# Patient Record
Sex: Female | Born: 1990 | Race: White | Hispanic: No | Marital: Single | Smoking: Never smoker
Health system: Southern US, Community
[De-identification: ages and names within clinical notes are randomized; demographics above are authoritative.]

## PROBLEM LIST (undated history)

## (undated) DIAGNOSIS — F329 Major depressive disorder, single episode, unspecified: Secondary | ICD-10-CM

## (undated) DIAGNOSIS — M797 Fibromyalgia: Secondary | ICD-10-CM

## (undated) DIAGNOSIS — F32A Depression, unspecified: Secondary | ICD-10-CM

## (undated) DIAGNOSIS — K589 Irritable bowel syndrome without diarrhea: Secondary | ICD-10-CM

## (undated) DIAGNOSIS — T7840XA Allergy, unspecified, initial encounter: Secondary | ICD-10-CM

## (undated) DIAGNOSIS — B019 Varicella without complication: Secondary | ICD-10-CM

## (undated) DIAGNOSIS — G43909 Migraine, unspecified, not intractable, without status migrainosus: Secondary | ICD-10-CM

## (undated) DIAGNOSIS — N3010 Interstitial cystitis (chronic) without hematuria: Secondary | ICD-10-CM

## (undated) DIAGNOSIS — F419 Anxiety disorder, unspecified: Secondary | ICD-10-CM

## (undated) HISTORY — DX: Interstitial cystitis (chronic) without hematuria: N30.10

## (undated) HISTORY — DX: Allergy, unspecified, initial encounter: T78.40XA

## (undated) HISTORY — DX: Depression, unspecified: F32.A

## (undated) HISTORY — DX: Migraine, unspecified, not intractable, without status migrainosus: G43.909

## (undated) HISTORY — DX: Varicella without complication: B01.9

## (undated) HISTORY — DX: Irritable bowel syndrome without diarrhea: K58.9

## (undated) HISTORY — DX: Anxiety disorder, unspecified: F41.9

## (undated) HISTORY — DX: Major depressive disorder, single episode, unspecified: F32.9

---
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
## 2011-10-26 LAB — HM PAP SMEAR: HM Pap smear: NORMAL

## 2012-10-04 ENCOUNTER — Ambulatory Visit: Payer: Self-pay | Admitting: Internal Medicine

## 2012-10-13 ENCOUNTER — Encounter: Payer: Self-pay | Admitting: Internal Medicine

## 2012-10-25 ENCOUNTER — Encounter: Payer: Self-pay | Admitting: Internal Medicine

## 2012-10-25 ENCOUNTER — Ambulatory Visit (INDEPENDENT_AMBULATORY_CARE_PROVIDER_SITE_OTHER): Payer: BC Managed Care – PPO | Admitting: Internal Medicine

## 2012-10-25 VITALS — BP 108/72 | HR 109 | Temp 98.9°F | Resp 16 | Ht 65.0 in | Wt 132.5 lb

## 2012-10-25 DIAGNOSIS — N926 Irregular menstruation, unspecified: Secondary | ICD-10-CM

## 2012-10-25 DIAGNOSIS — R002 Palpitations: Secondary | ICD-10-CM

## 2012-10-25 DIAGNOSIS — Z30011 Encounter for initial prescription of contraceptive pills: Secondary | ICD-10-CM

## 2012-10-25 DIAGNOSIS — G43709 Chronic migraine without aura, not intractable, without status migrainosus: Secondary | ICD-10-CM | POA: Insufficient documentation

## 2012-10-25 DIAGNOSIS — N3010 Interstitial cystitis (chronic) without hematuria: Secondary | ICD-10-CM

## 2012-10-25 DIAGNOSIS — Z309 Encounter for contraceptive management, unspecified: Secondary | ICD-10-CM

## 2012-10-25 DIAGNOSIS — R300 Dysuria: Secondary | ICD-10-CM

## 2012-10-25 DIAGNOSIS — Z789 Other specified health status: Secondary | ICD-10-CM

## 2012-10-25 DIAGNOSIS — D509 Iron deficiency anemia, unspecified: Secondary | ICD-10-CM

## 2012-10-25 DIAGNOSIS — Z3009 Encounter for other general counseling and advice on contraception: Secondary | ICD-10-CM

## 2012-10-25 HISTORY — DX: Interstitial cystitis (chronic) without hematuria: N30.10

## 2012-10-25 LAB — CBC WITH DIFFERENTIAL/PLATELET
Basophils Relative: 0.8 % (ref 0.0–3.0)
Eosinophils Relative: 2.8 % (ref 0.0–5.0)
HCT: 36.3 % (ref 36.0–46.0)
Hemoglobin: 12.1 g/dL (ref 12.0–15.0)
Lymphocytes Relative: 43.7 % (ref 12.0–46.0)
MCHC: 33.5 g/dL (ref 30.0–36.0)
Platelets: 382 10*3/uL (ref 150.0–400.0)
RBC: 4.45 Mil/uL (ref 3.87–5.11)
WBC: 6.6 10*3/uL (ref 4.5–10.5)

## 2012-10-25 LAB — POCT URINALYSIS DIPSTICK
Blood, UA: NEGATIVE
Leukocytes, UA: NEGATIVE
Nitrite, UA: NEGATIVE
Protein, UA: NEGATIVE
Spec Grav, UA: 1.025
Urobilinogen, UA: 0.2

## 2012-10-25 LAB — POCT URINE PREGNANCY: Preg Test, Ur: NEGATIVE

## 2012-10-25 MED ORDER — LEVONORGEST-ETH ESTRAD 91-DAY 0.15-0.03 &0.01 MG PO TABS: ORAL_TABLET | ORAL | Status: DC

## 2012-10-25 MED ORDER — METOPROLOL SUCCINATE ER 25 MG PO TB24: 25.0000 mg | ORAL_TABLET | Freq: Every day | ORAL | Status: DC

## 2012-10-25 MED ORDER — HYOSCYAMINE SULFATE 0.125 MG SL SUBL: 0.1250 mg | SUBLINGUAL_TABLET | SUBLINGUAL | Status: DC | PRN

## 2012-10-25 NOTE — Assessment & Plan Note (Signed)
Normal chest x ray.  Checking tsh, cbc and lytes.

## 2012-10-25 NOTE — Progress Notes (Signed)
Patient ID: Shelby Terry, female   DOB: 1990/06/09, 22 y.o.   MRN: [id number]   Patient Active Problem List   Diagnosis Date Noted  . Menstrual cycle disorder 10/25/2012  . Migraine, chronic, without aura 10/25/2012  . Chronic interstitial cystitis 10/25/2012    Subjective:  CC:   Chief Complaint  Patient presents with  . Establish Care    HPI:   Shelby Terry is a 22 y.o. female who presents as a new patient to establish primary care with the chief complaint of Knee pain .  Has stopped working out because  Of popliteal fullness which culminatesin a pop with is painful,  Followed by sweeling and pain ,  Wears a knee brace,  swellign reoslves for a few weeks ,  Then starts again.  Walks and runs for 30 minutes daily followed by weight training has avoided squats and lunges because o fthis issue,. No prior x rays or ultrasounds.    Xray of left hip of inguinal pain,  Was normal.  Pain resolved.    migraines ,  Chronic, now perimenstrual lasting 3 to 4 days, despite use of birth control for management of PMDD with heavy cramping and menorrhagia. Now also for pregnancy prevention.  Trial of elavil  With previous titrations  Not successful.  Prior neurology eval in high school  For prophylaxis with multiple trials,  None worked.  Records from other doc needed.    Interstitial cystis/IBS/migraines.  IC diagnosed by Urology in [place] by [place].  Frustrated  Has not seen him in 3 years bc they offered no therapy except surgery.  Sy,mptoms have improved with improved hydration, diet and exercise.  Occurring once a week,  Aggravated by alcohol and sex. Marland Kitchen No prior trial of antispasmodic. Having symptoms of endometriosis.  Not sure if the tow are related. No prior formal urogyn eval.    Shortness of breath,  Random,  Feels like she can't get a full breath in,  Accompanied by palpitations but never chest pain . Has occurred during workouts and at rest,  Lately all at rest.  Caffeine consumption  is zero.       Past Medical History  Diagnosis Date  . Chicken pox 1994  . Depression   . Allergy   . Irritable bowel syndrome y-8  . Migraines 2008    History reviewed. No pertinent past surgical history.  Family History  Problem Relation Age of Onset  . Hypertension Mother   . Hyperlipidemia Father   . Hypertension Father   . Diabetes Father   . Cancer Maternal Uncle   . Cancer Paternal Aunt   . Cancer Maternal Grandmother   . Hypertension Maternal Grandfather   . Hyperlipidemia Maternal Grandfather   . Cancer Paternal Grandmother   . Cancer Paternal Grandfather     History   Social History  . Marital Status: Single    Spouse Name: N/A    Number of Children: N/A  . Years of Education: N/A   Occupational History  . Not on file.   Social History Main Topics  . Smoking status: Never Smoker   . Smokeless tobacco: Never Used  . Alcohol Use: Yes  . Drug Use: No  . Sexually Active: Yes -- Female partner(s)   Other Topics Concern  . Not on file   Social History Narrative  . No narrative on file    Allergies  Allergen Reactions  . Aspirin Swelling    Throat swelling  . Ibuprofen Swelling  Throat swelling       Review of Systems:   The remainder of the review of systems was negative except those addressed in the HPI.   Objective:  BP 108/72  Pulse 109  Temp(Src) 98.9 F (37.2 C) (Oral)  Resp 16  Ht 5\' 5"  (1.651 m)  Wt 132 lb 8 oz (60.102 kg)  BMI 22.05 kg/m2  SpO2 98%  LMP 10/05/2012  General appearance: alert, cooperative and appears stated age Ears: normal TM's and external ear canals both ears Throat: lips, mucosa, and tongue normal; teeth and gums normal Neck: no adenopathy, no carotid bruit, supple, symmetrical, trachea midline and thyroid not enlarged, symmetric, no tenderness/mass/nodules Back: symmetric, no curvature. ROM normal. No CVA tenderness. Lungs: clear to auscultation bilaterally Heart: regular rate and rhythm, S1, S2  normal, no murmur, click, rub or gallop Abdomen: soft, non-tender; bowel sounds normal; no masses,  no organomegaly Pulses: 2+ and symmetric Skin: Skin color, texture, turgor normal. No rashes or lesions Lymph nodes: Cervical, supraclavicular, and axillary nodes normal.  Assessment and Plan:  Menstrual cycle disorder Trial of seasonique,  Checking thyroid function  Migraine, chronic, without aura Trial of beta blocker and seasonique to reduce hormonal fluctuations   Chronic interstitial cystitis Checking UA,  Trial of hyoscyamine.  Palpitations Normal chest x ray.  Checking tsh, cbc and lytes.    Updated Medication List Outpatient Encounter Prescriptions as of 10/25/2012  Medication Sig Dispense Refill  . amitriptyline (ELAVIL) 25 MG tablet Take 25 mg by mouth at bedtime.      . hyoscyamine (LEVSIN SL) 0.125 MG SL tablet Place 1 tablet (0.125 mg total) under the tongue every 4 (four) hours as needed for cramping.  30 tablet  3  . Levonorgestrel-Ethinyl Estradiol (AMETHIA,CAMRESE) 0.15-0.03 &0.01 MG tablet 1 tablet daily as directed on package  1 Package  4  . metoprolol succinate (TOPROL-XL) 25 MG 24 hr tablet Take 1 tablet (25 mg total) by mouth daily.  30 tablet  3  . norethindrone-ethinyl estradiol (MICROGESTIN,JUNEL,LOESTRIN) 1-20 MG-MCG tablet Take 1 tablet by mouth daily.      . sertraline (ZOLOFT) 25 MG tablet Take 25 mg by mouth daily.       No facility-administered encounter medications on file as of 10/25/2012.

## 2012-10-25 NOTE — Assessment & Plan Note (Signed)
Checking UA,  Trial of hyoscyamine.

## 2012-10-25 NOTE — Patient Instructions (Addendum)
We are starting several new medications today :  1) Toprol XL:  Take once daily 77for pulse and headache prevention) , can take at bedtime if needed  2) Seasonique:  New 90 day birth control (active tablet daily for 84 days,  Then inert tablet for 7 days) Start on the Sunday after the onset of bleeding/menstruation  3) hysocyamine: antispasmodic to take as needed for pelvic cramping  Urine test and blood tests today for baseline labs on thryoid,  Anemia,  Electrolytes,  Etc.

## 2012-10-25 NOTE — Assessment & Plan Note (Signed)
Trial of seasonique,  Checking thyroid function

## 2012-10-25 NOTE — Assessment & Plan Note (Signed)
Trial of beta blocker and seasonique to reduce hormonal fluctuations

## 2012-10-26 DIAGNOSIS — D509 Iron deficiency anemia, unspecified: Secondary | ICD-10-CM | POA: Insufficient documentation

## 2012-10-26 LAB — COMPREHENSIVE METABOLIC PANEL
Albumin: 3.6 g/dL (ref 3.5–5.2)
Calcium: 8.7 mg/dL (ref 8.4–10.5)
GFR: 107.68 mL/min (ref >60.00)
Potassium: 4 mEq/L (ref 3.5–5.1)
Total Bilirubin: 0.4 mg/dL (ref 0.3–1.2)

## 2012-10-26 LAB — IRON AND TIBC
Iron: 32 ug/dL — ABNORMAL LOW (ref 42–145)
UIBC: >575 ug/dL — ABNORMAL HIGH (ref 125–400)

## 2012-10-26 MED ORDER — FERROUS FUM-IRON POLYSACCH 162-115.2 MG PO CAPS: 1.0000 | ORAL_CAPSULE | Freq: Every day | ORAL | Status: DC

## 2012-10-26 NOTE — Addendum Note (Signed)
Addended by: Sherlene Shams on: 10/26/2012 09:27 PM   Modules accepted: Orders

## 2012-11-08 ENCOUNTER — Other Ambulatory Visit (HOSPITAL_COMMUNITY)
Admission: RE | Admit: 2012-11-08 | Discharge: 2012-11-08 | Disposition: A | Discharge status: Home / Self Care | Payer: BC Managed Care – PPO | Source: Ambulatory Visit | Attending: Internal Medicine | Admitting: Internal Medicine

## 2012-11-08 ENCOUNTER — Encounter: Payer: Self-pay | Admitting: Internal Medicine

## 2012-11-08 ENCOUNTER — Ambulatory Visit (INDEPENDENT_AMBULATORY_CARE_PROVIDER_SITE_OTHER): Payer: BC Managed Care – PPO | Admitting: Internal Medicine

## 2012-11-08 VITALS — BP 108/62 | HR 93 | Temp 98.7°F | Resp 16 | Ht 64.0 in | Wt 132.8 lb

## 2012-11-08 DIAGNOSIS — Z1151 Encounter for screening for human papillomavirus (HPV): Secondary | ICD-10-CM | POA: Insufficient documentation

## 2012-11-08 DIAGNOSIS — Z124 Encounter for screening for malignant neoplasm of cervix: Secondary | ICD-10-CM

## 2012-11-08 DIAGNOSIS — D509 Iron deficiency anemia, unspecified: Secondary | ICD-10-CM

## 2012-11-08 DIAGNOSIS — Z01419 Encounter for gynecological examination (general) (routine) without abnormal findings: Secondary | ICD-10-CM | POA: Insufficient documentation

## 2012-11-08 DIAGNOSIS — N3010 Interstitial cystitis (chronic) without hematuria: Secondary | ICD-10-CM

## 2012-11-08 DIAGNOSIS — Z0000 Encounter for general adult medical examination without abnormal findings: Secondary | ICD-10-CM

## 2012-11-08 DIAGNOSIS — G43709 Chronic migraine without aura, not intractable, without status migrainosus: Secondary | ICD-10-CM

## 2012-11-08 DIAGNOSIS — Z113 Encounter for screening for infections with a predominantly sexual mode of transmission: Secondary | ICD-10-CM

## 2012-11-08 DIAGNOSIS — K589 Irritable bowel syndrome without diarrhea: Secondary | ICD-10-CM

## 2012-11-08 NOTE — Progress Notes (Signed)
Patient ID: Shelby Terry, female   DOB: February 24, 1991, 24 y.o.   MRN: [id number]   Subjective:     Shelby Terry is a 22 y.o. female and is here for a comprehensive physical exam. The patient reports mild constipation from iron supplements prescribed for iron deficiency secondary to menorrhagia.   Periods are heavy but regulated by OCPS,  In a monogamous relationship but not using barrier protection, sexually actve. She is moving to  [place] in one week for grad school    History   Social History  . Marital Status: Single    Spouse Name: N/A    Number of Children: N/A  . Years of Education: N/A   Occupational History  . Not on file.   Social History Main Topics  . Smoking status: Never Smoker   . Smokeless tobacco: Never Used  . Alcohol Use: Yes  . Drug Use: No  . Sexually Active: Yes -- Female partner(s)   Other Topics Concern  . Not on file   Social History Narrative  . No narrative on file   Health Maintenance  Topic Date Due  . Tetanus/tdap  01/11/2008  . Influenza Vaccine  01/09/2013  . Pap Smear  11/09/2015    The following portions of the patient's history were reviewed and updated as appropriate: allergies, current medications, past family history, past medical history, past social history, past surgical history and problem list.  Review of Systems A comprehensive review of systems was negative.   Objective:  BP 108/62  Pulse 93  Temp(Src) 98.7 F (37.1 C) (Oral)  Resp 16  Ht 5\' 4"  (1.626 m)  Wt 132 lb 12 oz (60.215 kg)  BMI 22.78 kg/m2  SpO2 100%  LMP 11/01/2012   General Appearance:    Alert, cooperative, no distress, appears stated age  Head:    Normocephalic, without obvious abnormality, atraumatic  Eyes:    PERRL, conjunctiva/corneas clear, EOM's intact, fundi    benign, both eyes  Ears:    Normal TM's and external ear canals, both ears  Nose:   Nares normal, septum midline, mucosa normal, no drainage    or sinus tenderness  Throat:   Lips,  mucosa, and tongue normal; teeth and gums normal  Neck:   Supple, symmetrical, trachea midline, no adenopathy;    thyroid:  no enlargement/tenderness/nodules; no carotid   bruit or JVD  Back:     Symmetric, no curvature, ROM normal, no CVA tenderness  Lungs:     Clear to auscultation bilaterally, respirations unlabored  Chest Wall:    No tenderness or deformity   Heart:    Regular rate and rhythm, S1 and S2 normal, no murmur, rub   or gallop  Breast Exam:    No tenderness, masses, or nipple abnormality  Abdomen:     Soft, non-tender, bowel sounds active all four quadrants,    no masses, no organomegaly  Genitalia:    Pelvic: cervix normal in appearance, external genitalia normal, no adnexal masses or tenderness, no cervical motion tenderness, rectovaginal septum normal, uterus normal size, shape, and consistency and vagina normal without discharge  Extremities:   Extremities normal, atraumatic, no cyanosis or edema  Pulses:   2+ and symmetric all extremities  Skin:   Skin color, texture, turgor normal, no rashes or lesions  Lymph nodes:   Cervical, supraclavicular, and axillary nodes normal  Neurologic:   CNII-XII intact, normal strength, sensation and reflexes    throughout     Assessment:   Anemia,  iron deficiency Oral iron prescribed.  Some constipation resulting.  Colace recommended.   Routine general medical examination at a health care facility Annual comprehensive exam was done including breast, pelvic and PAP smear. STD screenings  Done today  Chronic interstitial cystitis Managed with elavil.  No changes today  Migraine, chronic, without aura She did not start the beta blocker.  Continue OCPS  IBS (irritable bowel syndrome) Diagnosed by pror PCP.  Adding prn hyoscyamine, Increase zoloft  to 50 mg discussed today.    Updated Medication List Outpatient Encounter Prescriptions as of 11/08/2012  Medication Sig Dispense Refill  . amitriptyline (ELAVIL) 25 MG tablet Take 25  mg by mouth at bedtime.      . ferrous fumarate-iron polysaccharide complex (TANDEM) 162-115.2 MG CAPS Take 1 capsule by mouth daily with breakfast.  30 capsule  5  . hyoscyamine (LEVSIN SL) 0.125 MG SL tablet Place 1 tablet (0.125 mg total) under the tongue every 4 (four) hours as needed for cramping.  30 tablet  3  . Levonorgestrel-Ethinyl Estradiol (AMETHIA,CAMRESE) 0.15-0.03 &0.01 MG tablet 1 tablet daily as directed on package  1 Package  4  . sertraline (ZOLOFT) 25 MG tablet Take 25 mg by mouth daily.      . [DISCONTINUED] metoprolol succinate (TOPROL-XL) 25 MG 24 hr tablet Take 1 tablet (25 mg total) by mouth daily.  30 tablet  3  . [DISCONTINUED] norethindrone-ethinyl estradiol (MICROGESTIN,JUNEL,LOESTRIN) 1-20 MG-MCG tablet Take 1 tablet by mouth daily.       No facility-administered encounter medications on file as of 11/08/2012.

## 2012-11-08 NOTE — Patient Instructions (Addendum)
Add colace daily for the iron induced constipation  100 mg once or twice daily (as needed)  Trial increasing your dose of sertraline to 50 mg for a few weeks to treat your IBS and intersitital cystitis .  I Will refill at 25 mg for now; we can change the prescription dose if higher dose helps.    Screening for HIV, Hep C, etc today (advised for all sexually active adults)   I would repeat your iron studies in 3 months

## 2012-11-09 LAB — HIV ANTIBODY (ROUTINE TESTING W REFLEX): HIV: NONREACTIVE

## 2012-11-09 LAB — HEPATITIS B SURFACE ANTIBODY,QUALITATIVE: Hep B S Ab: REACTIVE — AB

## 2012-11-11 DIAGNOSIS — Z0000 Encounter for general adult medical examination without abnormal findings: Secondary | ICD-10-CM | POA: Insufficient documentation

## 2012-11-11 DIAGNOSIS — K921 Melena: Secondary | ICD-10-CM | POA: Insufficient documentation

## 2012-11-11 NOTE — Assessment & Plan Note (Signed)
She did not start the beta blocker.  Continue OCPS

## 2012-11-11 NOTE — Assessment & Plan Note (Signed)
Oral iron prescribed.  Some constipation resulting.  Colace recommended.

## 2012-11-11 NOTE — Assessment & Plan Note (Signed)
Managed with elavil.  No changes today

## 2012-11-11 NOTE — Assessment & Plan Note (Addendum)
Diagnosed by pror PCP.  Adding prn hyoscyamine, Increase zoloft  to 50 mg discussed today.

## 2012-11-11 NOTE — Assessment & Plan Note (Signed)
Annual comprehensive exam was done including breast, pelvic and PAP smear. STD screenings  Done today

## 2012-11-15 ENCOUNTER — Telehealth: Payer: Self-pay | Admitting: *Deleted

## 2012-11-15 MED ORDER — AMITRIPTYLINE HCL 25 MG PO TABS: 25.0000 mg | ORAL_TABLET | Freq: Every day | ORAL | Status: DC

## 2012-11-15 MED ORDER — SERTRALINE HCL 25 MG PO TABS: 25.0000 mg | ORAL_TABLET | Freq: Every day | ORAL | Status: DC

## 2012-11-15 NOTE — Telephone Encounter (Signed)
Patient needs refill Amitriptyline and Zoloft sent to [facility] in [place]. Fax [phone]. Patient at school .

## 2012-11-15 NOTE — Telephone Encounter (Signed)
That [facility] is not in system.  Can fax once signed

## 2012-11-16 NOTE — Telephone Encounter (Signed)
Patient notified as requested. 

## 2013-10-11 ENCOUNTER — Other Ambulatory Visit: Payer: Self-pay | Admitting: *Deleted

## 2013-10-11 MED ORDER — LEVONORGEST-ETH ESTRAD 91-DAY 0.15-0.03 &0.01 MG PO TABS: ORAL_TABLET | ORAL | Status: DC

## 2013-10-11 MED ORDER — SERTRALINE HCL 25 MG PO TABS: 25.0000 mg | ORAL_TABLET | Freq: Every day | ORAL | Status: DC

## 2013-10-11 MED ORDER — AMITRIPTYLINE HCL 25 MG PO TABS: 25.0000 mg | ORAL_TABLET | Freq: Every day | ORAL | Status: DC

## 2013-10-11 NOTE — Telephone Encounter (Signed)
Ok to refill,  Authorized in epic but sent to [facility] via epic please correct

## 2013-10-11 NOTE — Telephone Encounter (Signed)
Rx faxed to pharmacy  

## 2013-10-11 NOTE — Telephone Encounter (Signed)
Pt's mother sent mychart message: "Shelby Terry can't come home until Spring 2016 due to required [employer] school schedule and cost of flying home. She used [facility] [phone], but I think we transferred scripts from [facility]. She needs the following scripts renewed: Sertraline 25 mg TAB LEG (Qty 90) 1 time daily Camrese TAB TEV (Qty 91) 1 time daily Amitriptylin (Qty 90) 1 time daily Thank you! Shelby Terry"   Last visit 7/14. Ok refills?

## 2014-01-29 ENCOUNTER — Telehealth: Payer: Self-pay | Admitting: Internal Medicine

## 2014-01-29 NOTE — Telephone Encounter (Signed)
Appointment schedule left voicemail with appointment.

## 2014-01-29 NOTE — Telephone Encounter (Signed)
Yes you can use th 5:15 and 5:30 slots

## 2014-01-29 NOTE — Telephone Encounter (Signed)
Pt's mom called in and stated pt will be in town on October 5th and was wanting to see if Dr. Darrick Huntsman could squeeze her in for a cpe that day at any time. Please advise.

## 2014-01-29 NOTE — Telephone Encounter (Signed)
Please advise no 30 min available that day unless we use acute?

## 2014-02-05 ENCOUNTER — Encounter: Payer: BC Managed Care – PPO | Admitting: Internal Medicine

## 2014-02-12 ENCOUNTER — Encounter: Payer: BC Managed Care – PPO | Admitting: Internal Medicine

## 2014-02-19 ENCOUNTER — Telehealth: Payer: Self-pay | Admitting: Internal Medicine

## 2014-02-19 NOTE — Telephone Encounter (Signed)
Patient DPR notified and patient notified patient is setting up MYChart account.

## 2014-02-19 NOTE — Telephone Encounter (Signed)
Olegario MessierKathy,  See my response below to mother:  Elnita MaxwellCheryl,  Neither one of those venues are HIPAA protected  so I cannot use them for patient related correspondence. .  I will have my staff call [name] so she can set up her MyChart acct.  Regards,  Dr. Darrick Huntsmanullo   ===View-only below this line===   ----- Message -----    From: Cipriano BunkerALBERTI,[name] A    Sent: 02/19/2014  9:04 AM EDT      To: Duncan DullULLO,TERESA, MD Subject: Non-Urgent Medical Question  Hi Dr Isac Sarnaullo, Shelby Terry did not make it into town so she will not be able to see you until Thanksgiving.  She has had a rash diagnosed as impetigo. She has been on oral and topical antibiotics but it is still oozing and spreading from breast and underarm,  but it has been almost 2 months.  Can you text her [phone] or email [email or web address] as she hasn't set up her my chart?  She has an appointment with you 04/04/14 3:45.  Thank you so much for helping her.  She has a lot of confidence in your opinion.  Thank you so much! Elnita Maxwellheryl

## 2014-02-20 ENCOUNTER — Encounter: Payer: Self-pay | Admitting: Internal Medicine

## 2014-04-04 ENCOUNTER — Encounter: Payer: Self-pay | Admitting: Internal Medicine

## 2014-04-04 ENCOUNTER — Ambulatory Visit (INDEPENDENT_AMBULATORY_CARE_PROVIDER_SITE_OTHER): Payer: BC Managed Care – PPO | Admitting: Internal Medicine

## 2014-04-04 VITALS — BP 122/78 | HR 114 | Temp 98.6°F | Resp 16 | Ht 64.25 in | Wt 133.5 lb

## 2014-04-04 DIAGNOSIS — Z23 Encounter for immunization: Secondary | ICD-10-CM

## 2014-04-04 DIAGNOSIS — N926 Irregular menstruation, unspecified: Secondary | ICD-10-CM

## 2014-04-04 DIAGNOSIS — Z0000 Encounter for general adult medical examination without abnormal findings: Secondary | ICD-10-CM

## 2014-04-04 DIAGNOSIS — G43709 Chronic migraine without aura, not intractable, without status migrainosus: Secondary | ICD-10-CM

## 2014-04-04 DIAGNOSIS — M130 Polyarthritis, unspecified: Secondary | ICD-10-CM

## 2014-04-04 DIAGNOSIS — D509 Iron deficiency anemia, unspecified: Secondary | ICD-10-CM

## 2014-04-04 DIAGNOSIS — K921 Melena: Secondary | ICD-10-CM

## 2014-04-04 LAB — CBC WITH DIFFERENTIAL/PLATELET
BASOS ABS: 0.1 10*3/uL (ref 0.0–0.1)
BASOS PCT: 1 % (ref 0–1)
Eosinophils Absolute: 0.1 10*3/uL (ref 0.0–0.7)
Eosinophils Relative: 2 % (ref 0–5)
HEMATOCRIT: 39.8 % (ref 36.0–46.0)
Hemoglobin: 13.2 g/dL (ref 12.0–15.0)
LYMPHS ABS: 2 10*3/uL (ref 0.7–4.0)
LYMPHS PCT: 32 % (ref 12–46)
MCH: 28 pg (ref 26.0–34.0)
MCHC: 33.2 g/dL (ref 30.0–36.0)
MCV: 84.3 fL (ref 78.0–100.0)
MPV: 9.1 fL — ABNORMAL LOW (ref 9.4–12.4)
Monocytes Absolute: 0.5 10*3/uL (ref 0.1–1.0)
Monocytes Relative: 9 % (ref 3–12)
Neutro Abs: 3.4 10*3/uL (ref 1.7–7.7)
Neutrophils Relative %: 56 % (ref 43–77)
Platelets: 414 10*3/uL — ABNORMAL HIGH (ref 150–400)
RBC: 4.72 MIL/uL (ref 3.87–5.11)
RDW: 14.1 % (ref 11.5–15.5)
WBC: 6.1 10*3/uL (ref 4.0–10.5)

## 2014-04-04 MED ORDER — ATENOLOL 25 MG PO TABS: 25.0000 mg | ORAL_TABLET | Freq: Every day | ORAL | Status: DC

## 2014-04-04 NOTE — Progress Notes (Signed)
Patient ID: Shelby Terry, female   DOB: 07/12/1990, 23 y.o.   MRN: [id number]     Subjective:     Shelby Terry is a 23 y.o. female and is here for a comprehensive physical exam. The patient reports multiple problems, requiring a 45 mintue visit today.  History   Social History  . Marital Status: Single    Spouse Name: N/A    Number of Children: N/A  . Years of Education: N/A   Occupational History  . Not on file.   Social History Main Topics  . Smoking status: Never Smoker   . Smokeless tobacco: Never Used  . Alcohol Use: Yes  . Drug Use: No  . Sexual Activity:    Partners: Male   Other Topics Concern  . Not on file   Social History Narrative   Health Maintenance  Topic Date Due  . Janet BerlinETANUS/TDAP  01/11/2008  . INFLUENZA VACCINE  12/10/2014  . PAP SMEAR  11/09/2015    The following portions of the patient's history were reviewed and updated as appropriate: allergies, current medications, past family history, past medical history, past social history, past surgical history and problem list.  Review of Systems A comprehensive review of systems was negative.   Objective:   BP 122/78 mmHg  Pulse 114  Temp(Src) 98.6 F (37 C) (Oral)  Resp 16  Ht 5' 4.25" (1.632 m)  Wt 133 lb 8 oz (60.555 kg)  BMI 22.74 kg/m2  SpO2 99%  LMP 01/30/2014 (Approximate)  General appearance: alert, cooperative and appears stated age Head: Normocephalic, without obvious abnormality, atraumatic Eyes: conjunctivae/corneas clear. PERRL, EOM's intact. Fundi benign. Ears: normal TM's and external ear canals both ears Nose: Nares normal. Septum midline. Mucosa normal. No drainage or sinus tenderness. Throat: lips, mucosa, and tongue normal; teeth and gums normal Neck: no adenopathy, no carotid bruit, no JVD, supple, symmetrical, trachea midline and thyroid not enlarged, symmetric, no tenderness/mass/nodules Lungs: clear to auscultation bilaterally Breasts: normal appearance, no masses  or tenderness Heart: regular rate and rhythm, S1, S2 normal, no murmur, click, rub or gallop Abdomen: soft, non-tender; bowel sounds normal; no masses,  no organomegaly Extremities: extremities normal, atraumatic, no cyanosis or edema Pulses: 2+ and symmetric Skin: Skin color, texture, turgor normal. No rashes or lesions Neurologic: Alert and oriented X 3, normal strength and tone. Normal symmetric reflexes. Normal coordination and gait.   .    Assessment and Plan:   Anemia, iron deficiency She did not tolerate Tandem due to recurrent nausea and remains iron deficient without anemia. Advised to try ferrous gluconate  (fergon) for better tolerability  Menstrual cycle disorder She continues to have breakthrough bleeding using Camrese and wants to have as few cycles as possible,.  Discussed changing to a formulation with higher doses of ethinyl estradiol and using the 91 day packs.  Will try Quartette,  Which has an escalating dose of ethinyl estradiol and may decrease the amount of breakthrough bleeding   Hematochezia Given her concurrent polyarthritis, need to rule out ulcerative colitis.  Referral to GI for evaluation .   Migraine, chronic, without aura Trial  of atenolol for prevention and continuous OCPS to decrease occurrence of menses   Visit for preventive health examination Annual wellness  exam was done as well as a comprehensive physical exam and management of acute and chronic conditions .  During the course of the visit the patient was educated and counseled about appropriate screening and preventive services including :  diabetes  screening, lipid analysis with projected  10 year  risk for CAD , nutrition counseling, colorectal cancer screening, and recommended immunizations.  Printed recommendations for health maintenance screenings was given.    Updated Medication List Outpatient Encounter Prescriptions as of 04/04/2014  Medication Sig  . hyoscyamine (LEVSIN SL) 0.125 MG SL  tablet Place 1 tablet (0.125 mg total) under the tongue every 4 (four) hours as needed for cramping.  . Levonorgestrel-Ethinyl Estradiol (AMETHIA,CAMRESE) 0.15-0.03 &0.01 MG tablet 1 tablet daily as directed on package  . sertraline (ZOLOFT) 25 MG tablet Take 1 tablet (25 mg total) by mouth daily.  . [DISCONTINUED] amitriptyline (ELAVIL) 25 MG tablet Take 1 tablet (25 mg total) by mouth at bedtime.  Marland Kitchen. atenolol (TENORMIN) 25 MG tablet Take 1 tablet (25 mg total) by mouth daily.  . ferrous gluconate (FERGON) 324 MG tablet Take 1 tablet (324 mg total) by mouth daily with breakfast.  . Levonorgest-Eth Est & Eth Est 42-21-21-7 DAYS TABS One tablet daily as directed  . [DISCONTINUED] ferrous fumarate-iron polysaccharide complex (TANDEM) 162-115.2 MG CAPS Take 1 capsule by mouth daily with breakfast. (Patient not taking: Reported on 04/04/2014)

## 2014-04-04 NOTE — Progress Notes (Signed)
Pre-visit discussion using our clinic review tool. No additional management support is needed unless otherwise documented below in the visit note.  

## 2014-04-04 NOTE — Patient Instructions (Signed)
We are changing your headache prevention medication to atenolol.  25 mg daily at bedtime   We are going to try to arrange a colonoscopy on your next break   I will notify you about the labs and change in contraception

## 2014-04-05 LAB — FERRITIN: FERRITIN: 8 ng/mL — AB (ref 10–291)

## 2014-04-05 LAB — IRON AND TIBC
%SAT: 15 % — AB (ref 20–55)
IRON: 86 ug/dL (ref 42–145)
TIBC: 592 ug/dL — ABNORMAL HIGH (ref 250–470)
UIBC: 506 ug/dL — ABNORMAL HIGH (ref 125–400)

## 2014-04-05 LAB — C-REACTIVE PROTEIN: CRP: 0.6 mg/dL — AB (ref <0.60)

## 2014-04-05 LAB — TSH: TSH: 2.854 u[IU]/mL (ref 0.350–4.500)

## 2014-04-07 MED ORDER — LEVONORGEST-ETH EST & ETH EST 42-21-21-7 DAYS PO TABS: ORAL_TABLET | ORAL | Status: DC

## 2014-04-07 MED ORDER — FERROUS GLUCONATE 324 (38 FE) MG PO TABS: 324.0000 mg | ORAL_TABLET | Freq: Every day | ORAL | Status: DC

## 2014-04-07 NOTE — Assessment & Plan Note (Addendum)
She continues to have breakthrough bleeding using Camrese and wants to have as few cycles as possible,.  Discussed changing to a formulation with higher doses of ethinyl estradiol and using the 91 day packs.  Will try Quartette,  Which has an escalating dose of ethinyl estradiol and may decrease the amount of breakthrough bleeding

## 2014-04-07 NOTE — Assessment & Plan Note (Addendum)
She did not tolerate Tandem due to recurrent nausea and remains iron deficient without anemia. Advised to try ferrous gluconate  (fergon) for better tolerability

## 2014-04-07 NOTE — Assessment & Plan Note (Signed)
Trial  of atenolol for prevention and continuous OCPS to decrease occurrence of menses

## 2014-04-07 NOTE — Assessment & Plan Note (Signed)
Given her concurrent polyarthritis, need to rule out ulcerative colitis.  Referral to GI for evaluation .

## 2014-04-07 NOTE — Assessment & Plan Note (Signed)

## 2014-04-08 ENCOUNTER — Encounter: Payer: Self-pay | Admitting: Internal Medicine

## 2014-04-09 MED ORDER — LEVONORGEST-ETH ESTRAD 91-DAY 0.15-0.03 &0.01 MG PO TABS: 1.0000 | ORAL_TABLET | Freq: Every day | ORAL | Status: DC

## 2014-04-09 NOTE — Telephone Encounter (Signed)
Shelby Terry,  I was unable to find an extended combination oral contraceptive taht wasn't any different than what you had, except for [facility], so I [facility] [place] [facility].    I'm sorry,  I don't remember which steroid cream we discussed and what it was for.  Can you referesh my memory?  Dr. Darrick Huntsmanullo

## 2014-04-10 ENCOUNTER — Telehealth: Payer: Self-pay | Admitting: *Deleted

## 2014-04-10 LAB — HLA-B27 ANTIGEN: DNA RESULT: NOT DETECTED

## 2014-04-10 MED ORDER — AMITRIPTYLINE HCL 25 MG PO TABS: 25.0000 mg | ORAL_TABLET | Freq: Every day | ORAL | Status: DC

## 2014-04-10 NOTE — Telephone Encounter (Signed)
My chart message sent,  Medication sent to [facility] out of town

## 2014-04-10 NOTE — Telephone Encounter (Signed)
Pt's mother called and left VM, stating at last visit, amitryptyline was stopped and pt started on atenolol for migraines. Pt's mother states it is helping with migraines but pt having increased stress, asking of she can start back on amtryptyline with the atenolol. States we may send mychart to patient with advice

## 2014-04-12 ENCOUNTER — Other Ambulatory Visit: Payer: Self-pay | Admitting: Internal Medicine

## 2014-04-12 MED ORDER — TRIAMCINOLONE ACETONIDE 0.1 % EX CREA: 1.0000 "application " | TOPICAL_CREAM | Freq: Two times a day (BID) | CUTANEOUS | Status: DC

## 2014-04-18 ENCOUNTER — Encounter: Payer: Self-pay | Admitting: Internal Medicine

## 2014-04-24 ENCOUNTER — Other Ambulatory Visit: Payer: Self-pay | Admitting: Internal Medicine

## 2014-04-24 DIAGNOSIS — M130 Polyarthritis, unspecified: Secondary | ICD-10-CM

## 2014-04-24 DIAGNOSIS — K921 Melena: Secondary | ICD-10-CM

## 2014-04-25 ENCOUNTER — Encounter: Payer: Self-pay | Admitting: Physician Assistant

## 2014-05-02 ENCOUNTER — Ambulatory Visit (INDEPENDENT_AMBULATORY_CARE_PROVIDER_SITE_OTHER): Payer: BC Managed Care – PPO | Admitting: Physician Assistant

## 2014-05-02 ENCOUNTER — Encounter: Payer: Self-pay | Admitting: Physician Assistant

## 2014-05-02 VITALS — BP 90/68 | HR 88 | Ht 64.25 in | Wt 137.2 lb

## 2014-05-02 DIAGNOSIS — K648 Other hemorrhoids: Secondary | ICD-10-CM

## 2014-05-02 DIAGNOSIS — K921 Melena: Secondary | ICD-10-CM

## 2014-05-02 MED ORDER — HYDROCORTISONE ACETATE 25 MG RE SUPP: 25.0000 mg | Freq: Two times a day (BID) | RECTAL | Status: DC

## 2014-05-02 NOTE — Progress Notes (Signed)
Very important that she follows up. If she were to get worse, she could seek medical attention in [place] rather than wait until spring break.

## 2014-05-02 NOTE — Patient Instructions (Signed)
We have sent the following medications to your pharmacy for you to pick up at your convenience: Anusol Suppositories, one per rectum twice daily for ten days   Please purchase Tucks wipes over the counter and use as directed

## 2014-05-02 NOTE — Progress Notes (Signed)
Patient ID: Shelby Terry, female   DOB: 25-Mar-1991, 25 y.o.   MRN: [id number]    HPI:    [name] is a 23 year old female referred for evaluation by Dr. Derrel Nip due to hematochezia.  Celestine states that for the past 3-4 years, she has had intermittent blood on the toilet tissue as well as blood dripping in the commode. She intermittently has rectal pain, itching, and burning. Her stools are normally formed. She does have a sensation of incomplete evacuation, but no anal seepage. She often has copious amounts of mucus with her stools. She has occasional lower abdominal cramping. She has been treated for IBS with amitriptyline at the [facility] pediatric gastro[facility] clinic. She last saw them in her early years of high school and says she has been doing fine since then with regards to her IBS. She states amitriptyline settled all her bowel problems down. Recently, she was seen for routine physical and found to be anemic. She states her menses are heavy and she's been having a lot of spotting. She also reports that she has been having joint pain primarily in her elbows knees and sacroiliac area for 5 years that has become severe over the past year. She was recently evaluated by her primary care physician and advised to seek GI evaluation due to her rectal bleeding and mucus with her stools. 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   Past Medical History  Diagnosis Date  . Chicken pox 1994  . Depression   . Allergy   . Irritable bowel syndrome y-8  . Migraines 2008  . Anxiety   . Interstitial cystitis     History reviewed. No pertinent past surgical history. Family History  Problem Relation Age of Onset  . Hyperlipidemia Mother   . Hyperlipidemia Father   . Hypertension Father   . Diabetes Father     . Prostate cancer Maternal Uncle   . Breast cancer Paternal Aunt   . Cervical cancer Maternal Grandmother   . Hypertension Maternal Grandfather   . Hyperlipidemia Maternal Grandfather   . Breast cancer Paternal Grandmother   . Lung cancer Paternal Grandfather   . Colon polyps Father   . Colon polyps Maternal Grandfather   . Diabetes Maternal Uncle   . Diabetes Maternal Grandfather   . Liver disease Maternal Uncle     x 2  . Emphysema Paternal Grandfather    History  Substance Use Topics  . Smoking status: Never Smoker   . Smokeless tobacco: Never Used  . Alcohol Use: 0.0 oz/week    0 Not specified per week     Comment: 1 per day   Current Outpatient Prescriptions  Medication Sig Dispense Refill  . amitriptyline (ELAVIL) 25 MG tablet Take 1 tablet (25 mg total) by mouth at bedtime. 90 tablet 1  . atenolol (TENORMIN) 25 MG tablet Take 1 tablet (25 mg total) by mouth daily. 90 tablet 3  . ferrous gluconate (FERGON) 324 MG tablet Take 1 tablet (324 mg total) by mouth daily with breakfast. 90 tablet 3  . hyoscyamine (LEVSIN SL) 0.125 MG SL tablet Place 1 tablet (0.125 mg total) under the tongue every 4 (four) hours as needed for cramping. 30 tablet 3  . Levonorgestrel-Ethinyl Estradiol (SEASONIQUE) 0.15-0.03 &0.01 MG tablet Take 1 tablet by mouth  daily. 1 Package 4  . sertraline (ZOLOFT) 25 MG tablet Take 1 tablet (25 mg total) by mouth daily. 90 tablet 3  . triamcinolone cream (KENALOG) 0.1 % Apply 1 application topically 2 (two) times daily. 30 g 2  . hydrocortisone (ANUSOL-HC) 25 MG suppository Place 1 suppository (25 mg total) rectally every 12 (twelve) hours. 12 suppository 0   No current facility-administered medications for this visit.   Allergies  Allergen Reactions  . Aspirin Swelling    Throat swelling  . Ibuprofen Swelling    Throat swelling  . Penicillins Itching and Swelling    At site of injection.     Review of Systems: Gen: Denies any fever, chills,  sweats, anorexia, fatigue, weakness, malaise, weight loss, and sleep disorder CV: Denies chest pain, angina, palpitations, syncope, orthopnea, PND, peripheral edema, and claudication. Resp: Denies dyspnea at rest, dyspnea with exercise, cough, sputum, wheezing, coughing up blood, and pleurisy. GI: Denies vomiting blood, jaundice, and fecal incontinence.   Denies dysphagia or odynophagia. GU : Denies urinary burning, blood in urine, urinary frequency, urinary hesitancy, nocturnal urination, and urinary incontinence. MS: Denies joint pain, limitation of movement, and swelling, stiffness, low back pain, extremity pain. Denies muscle weakness, cramps, atrophy.  Derm: Denies rash, itching, dry skin, hives, moles, warts, or unhealing ulcers.  Psych: Denies depression, anxiety, memory loss, suicidal ideation, hallucinations, paranoia, and confusion. Heme: Denies bruising, bleeding, and enlarged lymph nodes. Neuro:  Denies any headaches, dizziness, paresthesias. Endo:  Denies any problems with DM, thyroid, adrenal function   LAB RESULTS: Blood work on 04/04/2014 revealed a CBC with a white blood cell count of 6.1, hemoglobin 13.2, hematocrit 39.8, platelets 414,000. Iron 86, U IBC 506, TIBC 592, percent saturation 15. HLA-be 27 antigen not detected CRP 0.6 Ferritin 8 TSH 2.854    Physical Exam: BP 90/68 mmHg  Pulse 88  Ht 5' 4.25" (1.632 m)  Wt 137 lb 4 oz (62.256 kg)  BMI 23.37 kg/m2  LMP 05/01/2014 Constitutional: Pleasant,well-developed, female in no acute distress. HEENT: Normocephalic and atraumatic. Conjunctivae are normal. No scleral icterus. Neck supple.  Cardiovascular: Normal rate, regular rhythm.  Pulmonary/chest: Effort normal and breath sounds normal. No wheezing, rales or rhonchi. Abdominal: Soft, nondistended, nontender. Bowel sounds active throughout. There are no masses palpable. No hepatomegaly. RECTAL: scant brown stool heme neg, internal hemorrhoids on  anoscopy Extremities: no edema Lymphadenopathy: No cervical adenopathy noted. Neurological: Alert and oriented to person place and time. Skin: Skin is warm and dry. No rashes noted. Psychiatric: Normal mood and affect. Behavior is normal.  ASSESSMENT AND PLAN: 23 year old female with a history of IBS now with intermittent rectal bleeding, sensation of incomplete evacuation, iron deficiency anemia, and joint pain, referred for evaluation. She has been advised to undergo colonoscopy to evaluate for polyps, neoplasia or inflammatory bowel disease however due to her school schedule she is unable to complete this in the upcoming month. She will contact us and let us know when she will be home for spring break at which time she would like to proceed with colonoscopy as she has been troubled with intermittent rectal bleeding and mucus with stools for several years. In the meantime, for her internal hemorrhoids she will use Anusol HCC suppositories 1 per rectum twice a day for 10 days. She will use Tucks wipes after bowel movements. She will contact us for follow-up when she is back in the area.    Hady Niemczyk, Vita Barley PA-C 05/02/2014, 12:52 PM

## 2014-05-08 ENCOUNTER — Encounter: Payer: Self-pay | Admitting: Physician Assistant

## 2014-11-21 ENCOUNTER — Encounter: Payer: Self-pay | Admitting: Internal Medicine

## 2014-11-22 MED ORDER — AMITRIPTYLINE HCL 25 MG PO TABS: 25.0000 mg | ORAL_TABLET | Freq: Every day | ORAL | Status: DC

## 2014-11-22 MED ORDER — SERTRALINE HCL 25 MG PO TABS: 25.0000 mg | ORAL_TABLET | Freq: Every day | ORAL | Status: DC

## 2014-11-22 NOTE — Telephone Encounter (Signed)
PRESCRIPTIONS REQUESTED VIA MY CHART. PLEASE HOLD FOR CALL FROM PATIENT RE PHARMACY IN [place]

## 2015-05-01 ENCOUNTER — Other Ambulatory Visit: Payer: Self-pay | Admitting: Internal Medicine

## 2016-11-28 DIAGNOSIS — Z79899 Other long term (current) drug therapy: Secondary | ICD-10-CM | POA: Insufficient documentation

## 2016-11-28 DIAGNOSIS — J029 Acute pharyngitis, unspecified: Secondary | ICD-10-CM | POA: Insufficient documentation

## 2016-11-28 NOTE — ED Triage Notes (Signed)
Patient with complaint of burning to her throat that started this morning.

## 2016-11-29 ENCOUNTER — Encounter: Payer: Self-pay | Admitting: Emergency Medicine

## 2016-11-29 ENCOUNTER — Emergency Department
Admission: EM | Admit: 2016-11-29 | Discharge: 2016-11-29 | Disposition: A | Discharge status: Home / Self Care | Payer: Commercial Managed Care - PPO | Attending: Emergency Medicine | Admitting: Emergency Medicine

## 2016-11-29 DIAGNOSIS — J029 Acute pharyngitis, unspecified: Secondary | ICD-10-CM

## 2016-11-29 HISTORY — DX: Fibromyalgia: M79.7

## 2016-11-29 LAB — CBC
HEMATOCRIT: 38 % (ref 35.0–47.0)
Hemoglobin: 12.7 g/dL (ref 12.0–16.0)
MCH: 28.8 pg (ref 26.0–34.0)
MCHC: 33.5 g/dL (ref 32.0–36.0)
MCV: 85.9 fL (ref 80.0–100.0)
Platelets: 342 10*3/uL (ref 150–440)
RBC: 4.43 MIL/uL (ref 3.80–5.20)
RDW: 13 % (ref 11.5–14.5)
WBC: 7.6 10*3/uL (ref 3.6–11.0)

## 2016-11-29 LAB — MONONUCLEOSIS SCREEN: Mono Screen: NEGATIVE

## 2016-11-29 LAB — POCT RAPID STREP A: STREPTOCOCCUS, GROUP A SCREEN (DIRECT): NEGATIVE

## 2016-11-29 MED ORDER — GI COCKTAIL ~~LOC~~
ORAL | Status: AC
Start: 2016-11-29 — End: 2016-11-29
  Administered 2016-11-29: 30 mL via ORAL
  Filled 2016-11-29: qty 30

## 2016-11-29 MED ORDER — OMEPRAZOLE 40 MG PO CPDR: 40.0000 mg | DELAYED_RELEASE_CAPSULE | Freq: Every day | ORAL | 0 refills | Status: DC

## 2016-11-29 MED ORDER — GI COCKTAIL ~~LOC~~
30.0000 mL | Freq: Once | ORAL | Status: AC
  Administered 2016-11-29: 30 mL via ORAL

## 2016-11-29 NOTE — ED Triage Notes (Signed)
Pt reports sore throat daily for about 5 weeks; was treated for a sinus infection when pain began; then 2 weeks later was seen again and told strep was negative and the pain should pass; took tylenol 2 hours ago when throat pain was 10/10; now rates 7/10; painful to swallow; denies fever; denies cough; talking in complete coherent sentences

## 2016-11-29 NOTE — ED Provider Notes (Signed)
[facility] Emergency Department Provider Note    First MD Initiated Contact with Patient 11/29/16 0448     (approximate)  I have reviewed the triage vital signs and the nursing notes.   HISTORY  Chief Complaint Sore Throat    HPI Shelby Terry is a 26 y.o. female with below list of chronic medical conditions presents with a 5 week history of sore throat that was treated as a sinus infection and has completed antibiotic therapy patient states the symptoms have since persisted however describes the pain in her throat as burning. Patient states the maximum intensity her pain is 10 out of 10 current pain score 7 out of 10 continued to describe as burning. Patient denies any fever no cough.   Past Medical History:  Diagnosis Date  . Allergy   . Anxiety   . Chicken pox 1994  . Depression   . Fibromyalgia   . Interstitial cystitis   . Irritable bowel syndrome y-8  . Migraines 2008    Patient Active Problem List   Diagnosis Date Noted  . Visit for preventive health examination 11/11/2012  . Hematochezia 11/11/2012  . Anemia, iron deficiency 10/26/2012  . Menstrual cycle disorder 10/25/2012  . Migraine, chronic, without aura 10/25/2012  . Chronic interstitial cystitis 10/25/2012  . Palpitations 10/25/2012    History reviewed. No pertinent surgical history.  Prior to Admission medications   Medication Sig Start Date End Date Taking? Authorizing Provider  amitriptyline (ELAVIL) 25 MG tablet Take 1 tablet (25 mg total) by mouth at bedtime. 11/22/14   Sherlene Shamsullo, Teresa L, MD  atenolol (TENORMIN) 25 MG tablet Take 1 tablet (25 mg total) by mouth daily. 04/04/14   Sherlene Shamsullo, Teresa L, MD  ferrous gluconate (FERGON) 324 MG tablet Take 1 tablet (324 mg total) by mouth daily with breakfast. 04/07/14   Sherlene Shamsullo, Teresa L, MD  hydrocortisone (ANUSOL-HC) 25 MG suppository Place 1 suppository (25 mg total) rectally every 12 (twelve) hours. 05/02/14   Hvozdovic, Lawson FiscalLori P,  PA-C  hyoscyamine (LEVSIN SL) 0.125 MG SL tablet Place 1 tablet (0.125 mg total) under the tongue every 4 (four) hours as needed for cramping. 10/25/12   Sherlene Shamsullo, Teresa L, MD  Levonorgestrel-Ethinyl Estradiol (SEASONIQUE) 0.15-0.03 &0.01 MG tablet Take 1 tablet by mouth daily. 04/09/14   Sherlene Shamsullo, Teresa L, MD  omeprazole (PRILOSEC) 40 MG capsule Take 1 capsule (40 mg total) by mouth daily. 11/29/16 11/29/17  Darci CurrentBrown, Walls N, MD  sertraline (ZOLOFT) 25 MG tablet Take 1 tablet (25 mg total) by mouth daily. 11/22/14   Sherlene Shamsullo, Teresa L, MD  triamcinolone cream (KENALOG) 0.1 % Apply 1 application topically 2 (two) times daily. 04/12/14   Sherlene Shamsullo, Teresa L, MD    Allergies Aspirin; Ibuprofen; and Penicillins  Family History  Problem Relation Age of Onset  . Hyperlipidemia Mother   . Hyperlipidemia Father   . Hypertension Father   . Diabetes Father   . Colon polyps Father   . Cervical cancer Maternal Grandmother   . Hypertension Maternal Grandfather   . Hyperlipidemia Maternal Grandfather   . Colon polyps Maternal Grandfather   . Diabetes Maternal Grandfather   . Breast cancer Paternal Grandmother   . Lung cancer Paternal Grandfather   . Emphysema Paternal Grandfather   . Prostate cancer Maternal Uncle   . Breast cancer Paternal Aunt   . Diabetes Maternal Uncle   . Liver disease Maternal Uncle        x 2    Social  History Social History  Substance Use Topics  . Smoking status: Never Smoker  . Smokeless tobacco: Never Used  . Alcohol use 0.0 oz/week     Comment: 1 per day    Review of Systems Constitutional: No fever/chills Eyes: No visual changes. ENT: Positive for sore throat. Cardiovascular: Denies chest pain. Respiratory: Denies shortness of breath. Gastrointestinal: No abdominal pain.  No nausea, no vomiting.  No diarrhea.  No constipation. Genitourinary: Negative for dysuria. Musculoskeletal: Negative for neck pain.  Negative for back pain. Integumentary: Negative for  rash. Neurological: Negative for headaches, focal weakness or numbness.   ____________________________________________   PHYSICAL EXAM:  VITAL SIGNS: ED Triage Vitals [11/29/16 0027]  Enc Vitals Group     BP 106/62     Pulse Rate 97     Resp 18     Temp 98.4 F (36.9 C)     Temp src      SpO2 99 %     Weight 63.5 kg (140 lb)     Height 1.626 m (5\' 4" )     Head Circumference      Peak Flow      Pain Score 6     Pain Loc      Pain Edu?      Excl. in GC?     Constitutional: Alert and oriented. Well appearing and in no acute distress. Eyes: Conjunctivae are normal.  Head: Atraumatic. Mouth/Throat: Mucous membranes are moist. Oropharynx non-erythematous. Neck: No stridor.   Cardiovascular: Normal rate, regular rhythm. Good peripheral circulation. Grossly normal heart sounds. Respiratory: Normal respiratory effort.  No retractions. Lungs CTAB. Gastrointestinal: Soft and nontender. No distention.  Musculoskeletal: No lower extremity tenderness nor edema. No gross deformities of extremities. Neurologic:  Normal speech and language. No gross focal neurologic deficits are appreciated.  Skin:  Skin is warm, dry and intact. No rash noted. Psychiatric: Mood and affect are normal. Speech and behavior are normal.  ____________________________________________   LABS (all labs ordered are listed, but only abnormal results are displayed)  Labs Reviewed  MONONUCLEOSIS SCREEN  CBC  POCT RAPID STREP A    Procedures   ____________________________________________   INITIAL IMPRESSION / ASSESSMENT AND PLAN / ED COURSE  Pertinent labs & imaging results that were available during my care of the patient were reviewed by me and considered in my medical decision making (see chart for details).  26 year old female presenting with burning throat pain. Patient given a GI cocktail with improvement of symptoms except in the location where she was burning in her throat. Patient with no  fever rapid strep negative mono likewise negative. Concern for possible GERD given "burning" pain as such patient will be prescribed omeprazole and referred to ENT for further outpatient evaluation      ____________________________________________  FINAL CLINICAL IMPRESSION(S) / ED DIAGNOSES  Final diagnoses:  Sore throat     MEDICATIONS GIVEN DURING THIS VISIT:  Medications  gi cocktail (Maalox,Lidocaine,Donnatal) (30 mLs Oral Given 11/29/16 0548)     NEW OUTPATIENT MEDICATIONS STARTED DURING THIS VISIT:  New Prescriptions   OMEPRAZOLE (PRILOSEC) 40 MG CAPSULE    Take 1 capsule (40 mg total) by mouth daily.    Modified Medications   No medications on file    Discontinued Medications   No medications on file     Note:  This document was prepared using Dragon voice recognition software and may include unintentional dictation errors.    Darci Current, MD 11/29/16 [phone]

## 2017-02-08 NOTE — Telephone Encounter (Signed)
Error

## 2017-03-01 ENCOUNTER — Ambulatory Visit (INDEPENDENT_AMBULATORY_CARE_PROVIDER_SITE_OTHER): Payer: BLUE CROSS/BLUE SHIELD | Admitting: Internal Medicine

## 2017-03-01 ENCOUNTER — Encounter: Payer: Self-pay | Admitting: Internal Medicine

## 2017-03-01 VITALS — BP 98/72 | HR 122 | Temp 98.9°F | Resp 15 | Ht 64.0 in | Wt 148.8 lb

## 2017-03-01 DIAGNOSIS — Z23 Encounter for immunization: Secondary | ICD-10-CM

## 2017-03-01 DIAGNOSIS — Z79899 Other long term (current) drug therapy: Secondary | ICD-10-CM | POA: Diagnosis not present

## 2017-03-01 DIAGNOSIS — E7800 Pure hypercholesterolemia, unspecified: Secondary | ICD-10-CM

## 2017-03-01 DIAGNOSIS — D508 Other iron deficiency anemias: Secondary | ICD-10-CM

## 2017-03-01 DIAGNOSIS — N3010 Interstitial cystitis (chronic) without hematuria: Secondary | ICD-10-CM

## 2017-03-01 DIAGNOSIS — N926 Irregular menstruation, unspecified: Secondary | ICD-10-CM

## 2017-03-01 DIAGNOSIS — Z0000 Encounter for general adult medical examination without abnormal findings: Secondary | ICD-10-CM

## 2017-03-01 MED ORDER — DULOXETINE HCL 30 MG PO CPEP: 30.0000 mg | ORAL_CAPSULE | Freq: Every day | ORAL | 1 refills | Status: DC

## 2017-03-01 NOTE — Progress Notes (Signed)
Subjective:  Patient ID: Shelby Terry, female    DOB: 01/09/1991  Age: 26 y.o. MRN: [id number]  CC: The primary encounter diagnosis was Iron deficiency anemia secondary to inadequate dietary iron intake. Diagnoses of Need for immunization against influenza, Long-term use of high-risk medication, Pure hypercholesterolemia, Menstrual cycle disorder, Visit for preventive health examination, and Chronic interstitial cystitis were also pertinent to this visit.  HPI Shelby Johnmanda R Olliff presents for follow up on multiple issues.  Last seen Nov 2015.  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  [place] [address] .  Much happier.  Taking birth control, to manage cramp  and headaches.   Changed from seasonique to microgestin 2 years ago.  Recently started spotting. .  Last PAP smear 2017. Having some spotting .   Taking duloxetine for 1.5 years. For fibromyalgia  pain management. Reviewed prior treatments.  Using elavil at bedtime.  No prior trial of savella.     Outpatient Medications Prior to Visit  Medication Sig Dispense Refill  . amitriptyline (ELAVIL) 25 MG tablet Take 1 tablet (25 mg total) by mouth at bedtime. 90 tablet 1  . triamcinolone cream (KENALOG) 0.1 % Apply 1 application topically 2 (two) times daily. 30 g 2  . Levonorgestrel-Ethinyl Estradiol (SEASONIQUE) 0.15-0.03 &0.01 MG tablet Take 1 tablet by mouth daily. 1 Package 4  . atenolol (TENORMIN) 25 MG tablet Take 1 tablet (25 mg total) by mouth daily. (Patient not taking: Reported on 03/01/2017) 90 tablet 3  . ferrous gluconate (FERGON) 324 MG tablet Take 1 tablet (324 mg total) by mouth daily with breakfast. (Patient not taking: Reported on 03/01/2017) 90 tablet 3  . hydrocortisone (ANUSOL-HC) 25 MG suppository Place 1 suppository (25 mg total) rectally every 12 (twelve) hours. (Patient not taking: Reported on 03/01/2017) 12 suppository 0  . hyoscyamine (LEVSIN SL) 0.125 MG SL tablet Place 1 tablet (0.125  mg total) under the tongue every 4 (four) hours as needed for cramping. (Patient not taking: Reported on 03/01/2017) 30 tablet 3  . omeprazole (PRILOSEC) 40 MG capsule Take 1 capsule (40 mg total) by mouth daily. (Patient not taking: Reported on 03/01/2017) 30 capsule 0  . sertraline (ZOLOFT) 25 MG tablet Take 1 tablet (25 mg total) by mouth daily. (Patient not taking: Reported on 03/01/2017) 90 tablet 1   No facility-administered medications prior to visit.     Review of Systems;  Patient denies headache, fevers, malaise, unintentional weight loss, skin rash, eye pain, sinus congestion and sinus pain, sore throat, dysphagia,  hemoptysis , cough, dyspnea, wheezing, chest pain, palpitations, orthopnea, edema, abdominal pain, nausea, melena, diarrhea, constipation, flank pain, dysuria, hematuria, urinary  Frequency, nocturia, numbness, tingling, seizures,  Focal weakness, Loss of consciousness,  Tremor, insomnia, depression, anxiety, and suicidal ideation.      Objective:  BP 98/72 (BP Location: Left Arm, Patient Position: Sitting, Cuff Size: Normal)   Pulse (!) 122   Temp 98.9 F (37.2 C) (Oral)   Resp 15   Ht 5\' 4"  (1.626 m)   Wt 148 lb 12.8 oz (67.5 kg)   SpO2 99%   BMI 25.54 kg/m   BP Readings from Last 3 Encounters:  03/01/17 98/72  11/29/16 103/78  05/02/14 90/68    Wt Readings from Last 3 Encounters:  03/01/17 148 lb 12.8 oz (67.5 kg)  11/29/16 140 lb (63.5 kg)  05/02/14 137 lb 4 oz (62.3 kg)    General appearance: alert, cooperative and appears stated age  Ears: normal TM's and external ear canals both ears Throat: lips, mucosa, and tongue normal; teeth and gums normal Neck: no adenopathy, no carotid bruit, supple, symmetrical, trachea midline and thyroid not enlarged, symmetric, no tenderness/mass/nodules Back: symmetric, no curvature. ROM normal. No CVA tenderness. Lungs: clear to auscultation bilaterally Heart: regular rate and rhythm, S1, S2 normal, no murmur,  click, rub or gallop Abdomen: soft, non-tender; bowel sounds normal; no masses,  no organomegaly Pulses: 2+ and symmetric Skin: Skin color, texture, turgor normal. No rashes or lesions Lymph nodes: Cervical, supraclavicular, and axillary nodes normal.  No results found for: HGBA1C  Lab Results  Component Value Date   CREATININE 0.7 10/25/2012    Lab Results  Component Value Date   WBC 7.6 11/29/2016   HGB 12.7 11/29/2016   HCT 38.0 11/29/2016   PLT 342 11/29/2016   GLUCOSE 84 10/25/2012   ALT 18 10/25/2012   AST 23 10/25/2012   NA 138 10/25/2012   K 4.0 10/25/2012   CL 103 10/25/2012   CREATININE 0.7 10/25/2012   BUN 9 10/25/2012   CO2 19 10/25/2012   TSH 2.854 04/04/2014    No results found.  Assessment & Plan:   Problem List Items Addressed This Visit    RESOLVED: Anemia, iron deficiency - Primary   Relevant Medications   ferrous sulfate 325 (65 FE) MG tablet   vitamin B-12 (CYANOCOBALAMIN) 1000 MCG tablet   Other Relevant Orders   CBC with Differential/Platelet   Iron and TIBC   Chronic interstitial cystitis    Continue elavil      Menstrual cycle disorder    Intermenstrual spotting reported on migrogestin 1/20.  Will change to 1.5/30       Visit for preventive health examination    ,Annual comprehensive preventive exam was done as well as an evaluation and management of chronic conditions .  During the course of the visit the patient was educated and counseled about appropriate screening and preventive services including :  diabetes screening, lipid analysis with projected  10 year  risk for CAD , nutrition counseling, breast, cervical and colorectal cancer screening, and recommended immunizations.  Printed recommendations for health maintenance screenings was given       Other Visit Diagnoses    Need for immunization against influenza       Relevant Orders   Flu Vaccine QUAD 36+ mos IM (Completed)   Long-term use of high-risk medication       Relevant  Orders   Comprehensive metabolic panel   Pure hypercholesterolemia       Relevant Medications   atorvastatin (LIPITOR) 20 MG tablet   Other Relevant Orders   Lipid panel      I have discontinued Ms. Glassco's hyoscyamine, atenolol, ferrous gluconate, Levonorgestrel-Ethinyl Estradiol, hydrocortisone, sertraline, omeprazole, and MICROGESTIN. I have also changed her DULoxetine. Additionally, I am having her start on Norethindrone Acetate-Ethinyl Estradiol. Lastly, I am having her maintain her triamcinolone cream, amitriptyline, atorvastatin, ferrous sulfate, vitamin B-12, and Vitamin D (Cholecalciferol).  Meds ordered this encounter  Medications  . atorvastatin (LIPITOR) 20 MG tablet    Refill:  0  . DISCONTD: DULoxetine (CYMBALTA) 30 MG capsule    Refill:  1  . DISCONTD: MICROGESTIN 1-20 MG-MCG tablet    Refill:  1  . ferrous sulfate 325 (65 FE) MG tablet    Sig: Take 325 mg by mouth daily with breakfast.  . vitamin B-12 (CYANOCOBALAMIN) 1000 MCG tablet    Sig: Take 1,000 mcg by  mouth daily.  . Vitamin D, Cholecalciferol, 400 units TABS    Sig: Take 1 tablet by mouth daily.  . DULoxetine (CYMBALTA) 30 MG capsule    Sig: Take 1 capsule (30 mg total) by mouth daily.    Dispense:  90 capsule    Refill:  1    Keep on file for future refills  . Norethindrone Acetate-Ethinyl Estradiol (MICROGESTIN) 1.5-30 MG-MCG tablet    Sig: Take 1 tablet by mouth daily.    Dispense:  1 Package    Refill:  11    Medications Discontinued During This Encounter  Medication Reason  . atenolol (TENORMIN) 25 MG tablet Patient has not taken in last 30 days  . ferrous gluconate (FERGON) 324 MG tablet Patient has not taken in last 30 days  . hydrocortisone (ANUSOL-HC) 25 MG suppository Patient has not taken in last 30 days  . hyoscyamine (LEVSIN SL) 0.125 MG SL tablet Patient has not taken in last 30 days  . omeprazole (PRILOSEC) 40 MG capsule Patient has not taken in last 30 days  . sertraline (ZOLOFT)  25 MG tablet Patient has not taken in last 30 days  . Levonorgestrel-Ethinyl Estradiol (SEASONIQUE) 0.15-0.03 &0.01 MG tablet Patient has not taken in last 30 days  . DULoxetine (CYMBALTA) 30 MG capsule Reorder  . MICROGESTIN 1-20 MG-MCG tablet     Follow-up: No Follow-up on file.   Sherlene Shams, MD

## 2017-03-01 NOTE — Patient Instructions (Signed)
Welcome back!   we can recheck your cholesterol after 6 weeks of Lipitor suspension  I will find a higher estrogen component oral contraceptive for you to use

## 2017-03-04 ENCOUNTER — Encounter: Payer: Self-pay | Admitting: Internal Medicine

## 2017-03-04 ENCOUNTER — Telehealth: Payer: Self-pay | Admitting: Internal Medicine

## 2017-03-04 MED ORDER — NORETHINDRONE ACET-ETHINYL EST 1.5-30 MG-MCG PO TABS: 1.0000 | ORAL_TABLET | Freq: Every day | ORAL | 11 refills | Status: DC

## 2017-03-04 NOTE — Assessment & Plan Note (Signed)
,  Annual comprehensive preventive exam was done as well as an evaluation and management of chronic conditions .  During the course of the visit the patient was educated and counseled about appropriate screening and preventive services including :  diabetes screening, lipid analysis with projected  10 year  risk for CAD , nutrition counseling, breast, cervical and colorectal cancer screening, and recommended immunizations.  Printed recommendations for health maintenance screenings was given 

## 2017-03-04 NOTE — Assessment & Plan Note (Signed)
Continue elavil  °

## 2017-03-04 NOTE — Assessment & Plan Note (Signed)
Intermenstrual spotting reported on migrogestin 1/20.  Will change to 1.5/30

## 2017-03-08 NOTE — Telephone Encounter (Signed)
Error

## 2017-03-23 ENCOUNTER — Encounter: Payer: Self-pay | Admitting: Internal Medicine

## 2017-03-23 ENCOUNTER — Ambulatory Visit: Payer: BLUE CROSS/BLUE SHIELD | Admitting: Internal Medicine

## 2017-03-23 VITALS — BP 128/78 | HR 114 | Temp 97.6°F | Resp 20 | Wt 150.6 lb

## 2017-03-23 DIAGNOSIS — R509 Fever, unspecified: Secondary | ICD-10-CM

## 2017-03-23 DIAGNOSIS — J069 Acute upper respiratory infection, unspecified: Secondary | ICD-10-CM | POA: Diagnosis not present

## 2017-03-23 LAB — POCT RAPID STREP A (OFFICE): Rapid Strep A Screen: NEGATIVE

## 2017-03-23 LAB — POC INFLUENZA A&B (BINAX/QUICKVUE)
Influenza A, POC: NEGATIVE
Influenza B, POC: NEGATIVE

## 2017-03-23 MED ORDER — AZITHROMYCIN 250 MG PO TABS: ORAL_TABLET | ORAL | 0 refills | Status: DC

## 2017-03-23 NOTE — Progress Notes (Signed)
Patient ID: Broadus Johnmanda R Teasdale, female   DOB: 06/17/1990, 26 y.o.   MRN: [id number]   Subjective:    Patient ID: Broadus JohnAmanda R Potteiger, female    DOB: 05/02/1991, 26 y.o.   MRN: [id number]  HPI  Patient here as a work in with concerns regarding sore throat and congestion.  Symptoms started within the last few days.  Started with sore throat.  Some previous aching and sweating.  Had fever.  Tmax 100.7.  Sore throat continues.  Has had issues with strep in the past.  Taking tylneol.  Some drainage.  Cough. No chest congestion.  No sob.  Some occasional nausea.  No vomiting.  She is eating and drinking.  Some previous diarrhea.     Past Medical History:  Diagnosis Date  . Allergy   . Anxiety   . Chicken pox 1994  . Depression   . Fibromyalgia   . Interstitial cystitis   . Irritable bowel syndrome y-8  . Migraines 2008   History reviewed. No pertinent surgical history. Family History  Problem Relation Age of Onset  . Hyperlipidemia Mother   . Hyperlipidemia Father   . Hypertension Father   . Diabetes Father   . Colon polyps Father   . Cervical cancer Maternal Grandmother   . Hypertension Maternal Grandfather   . Hyperlipidemia Maternal Grandfather   . Colon polyps Maternal Grandfather   . Diabetes Maternal Grandfather   . Breast cancer Paternal Grandmother   . Lung cancer Paternal Grandfather   . Emphysema Paternal Grandfather   . Prostate cancer Maternal Uncle   . Breast cancer Paternal Aunt   . Diabetes Maternal Uncle   . Liver disease Maternal Uncle        x 2   Social History   Socioeconomic History  . Marital status: Single    Spouse name: None  . Number of children: 0  . Years of education: None  . Highest education level: None  Social Needs  . Financial resource strain: None  . Food insecurity - worry: None  . Food insecurity - inability: None  . Transportation needs - medical: None  . Transportation needs - non-medical: None  Occupational History  . Occupation:  Consulting civil engineerstudent  Tobacco Use  . Smoking status: Never Smoker  . Smokeless tobacco: Never Used  Substance and Sexual Activity  . Alcohol use: Yes    Alcohol/week: 0.0 oz    Comment: 1 per day  . Drug use: No  . Sexual activity: Yes    Partners: Female  Other Topics Concern  . None  Social History Narrative  . None    Outpatient Encounter Medications as of 03/23/2017  Medication Sig  . acetaminophen (TYLENOL) 325 MG tablet Take 325 mg every 4 (four) hours as needed by mouth.  Marland Kitchen. amitriptyline (ELAVIL) 25 MG tablet Take 1 tablet (25 mg total) by mouth at bedtime.  Marland Kitchen. atorvastatin (LIPITOR) 20 MG tablet   . DULoxetine (CYMBALTA) 30 MG capsule Take 1 capsule (30 mg total) by mouth daily.  . ferrous sulfate 325 (65 FE) MG tablet Take 325 mg by mouth daily with breakfast.  . Norethindrone Acetate-Ethinyl Estradiol (MICROGESTIN) 1.5-30 MG-MCG tablet Take 1 tablet by mouth daily.  Marland Kitchen. triamcinolone cream (KENALOG) 0.1 % Apply 1 application topically 2 (two) times daily.  . vitamin B-12 (CYANOCOBALAMIN) 1000 MCG tablet Take 1,000 mcg by mouth daily.  . Vitamin D, Cholecalciferol, 400 units TABS Take 1 tablet by mouth daily.  Marland Kitchen. azithromycin (ZITHROMAX) 250 MG  tablet Take 2 tablets x 1 day and then 1 tablet per day for four more days.   No facility-administered encounter medications on file as of 03/23/2017.     Outpatient Encounter Medications as of 03/23/2017  Medication Sig  . acetaminophen (TYLENOL) 325 MG tablet Take 325 mg every 4 (four) hours as needed by mouth.  Marland Kitchen. amitriptyline (ELAVIL) 25 MG tablet Take 1 tablet (25 mg total) by mouth at bedtime.  Marland Kitchen. atorvastatin (LIPITOR) 20 MG tablet   . DULoxetine (CYMBALTA) 30 MG capsule Take 1 capsule (30 mg total) by mouth daily.  . ferrous sulfate 325 (65 FE) MG tablet Take 325 mg by mouth daily with breakfast.  . Norethindrone Acetate-Ethinyl Estradiol (MICROGESTIN) 1.5-30 MG-MCG tablet Take 1 tablet by mouth daily.  Marland Kitchen. triamcinolone cream (KENALOG)  0.1 % Apply 1 application topically 2 (two) times daily.  . vitamin B-12 (CYANOCOBALAMIN) 1000 MCG tablet Take 1,000 mcg by mouth daily.  . Vitamin D, Cholecalciferol, 400 units TABS Take 1 tablet by mouth daily.  Marland Kitchen. azithromycin (ZITHROMAX) 250 MG tablet Take 2 tablets x 1 day and then 1 tablet per day for four more days.   No facility-administered encounter medications on file as of 03/23/2017.     Review of Systems  Constitutional: Positive for fever. Negative for appetite change.  HENT: Positive for congestion and postnasal drip. Negative for sinus pressure.   Respiratory: Positive for cough. Negative for chest tightness and shortness of breath.   Cardiovascular: Negative for chest pain.  Gastrointestinal: Positive for diarrhea and nausea. Negative for abdominal pain and vomiting.  Musculoskeletal: Negative for joint swelling and myalgias.  Skin: Negative for color change and rash.  Neurological: Negative for dizziness and headaches.       Objective:     Pulse rechecked by me:  104  Physical Exam  Constitutional: She appears well-developed and well-nourished. No distress.  HENT:  Mouth/Throat: Oropharynx is clear and moist.  Nares:  Slightly erythematous turbinates.  No significant tenderness to palpation over the sinuses.    Neck: Neck supple.  Cardiovascular: Normal rate and regular rhythm.  Pulmonary/Chest: Breath sounds normal. No respiratory distress. She has no wheezes.  Lymphadenopathy:    She has no cervical adenopathy.    BP 128/78 (BP Location: Left Arm, Patient Position: Sitting, Cuff Size: Normal)   Pulse (!) 114   Temp 97.6 F (36.4 C) (Oral)   Resp 20   Wt 150 lb 9.6 oz (68.3 kg)   SpO2 95%   BMI 25.85 kg/m  Wt Readings from Last 3 Encounters:  03/23/17 150 lb 9.6 oz (68.3 kg)  03/01/17 148 lb 12.8 oz (67.5 kg)  11/29/16 140 lb (63.5 kg)     Lab Results  Component Value Date   WBC 7.6 11/29/2016   HGB 12.7 11/29/2016   HCT 38.0 11/29/2016    PLT 342 11/29/2016   GLUCOSE 84 10/25/2012   ALT 18 10/25/2012   AST 23 10/25/2012   NA 138 10/25/2012   K 4.0 10/25/2012   CL 103 10/25/2012   CREATININE 0.7 10/25/2012   BUN 9 10/25/2012   CO2 19 10/25/2012   TSH 2.854 04/04/2014       Assessment & Plan:   Problem List Items Addressed This Visit    URI (upper respiratory infection)    Symptoms and exam as outlined.  Feel c/w URI.  Rapid strep and flu swab negative.  Treat with zpak as directed.  Saline nasal spray and nasacort nasal spray  as directed.  Robitussin DM.  Rest.  Fluids.  Discussed if symptoms worsened or did not improve, would need to be reevaluated.  Probiotic with abx.  Also discussed birth control not as protective on abx.        Relevant Medications   azithromycin (ZITHROMAX) 250 MG tablet    Other Visit Diagnoses    Fever, unspecified fever cause    -  Primary   Relevant Orders   POC Influenza A&B (Binax test) (Completed)   POCT rapid strep A (Completed)       Dale Troutville, MD

## 2017-03-23 NOTE — Patient Instructions (Addendum)
Saline nasal spray - flush nose at least 2-3x/day  nasacort nasal spray - 2 sprays each nostril one time per day.  Do this in the evening.    Take the antibiotic as directed.    Take a probiotic while you are on the antibiotic and for two weeks after completing the antibiotic.    Robitussin DM twice a day as needed.    Call with update if problems - before your trip.

## 2017-03-26 ENCOUNTER — Encounter: Payer: Self-pay | Admitting: Internal Medicine

## 2017-03-26 DIAGNOSIS — J069 Acute upper respiratory infection, unspecified: Secondary | ICD-10-CM | POA: Insufficient documentation

## 2017-03-26 NOTE — Assessment & Plan Note (Addendum)
Symptoms and exam as outlined.  Feel c/w URI.  Rapid strep and flu swab negative.  Treat with zpak as directed.  Saline nasal spray and nasacort nasal spray as directed.  Robitussin DM.  Rest.  Fluids.  Discussed if symptoms worsened or did not improve, would need to be reevaluated.  Probiotic with abx.  Also discussed birth control not as protective on abx.

## 2017-03-29 ENCOUNTER — Encounter: Payer: Self-pay | Admitting: Internal Medicine

## 2017-03-29 NOTE — Progress Notes (Unsigned)
Mailed unread my chart message dated 03/04/17.

## 2017-04-27 ENCOUNTER — Encounter: Payer: Self-pay | Admitting: Family Medicine

## 2017-04-27 ENCOUNTER — Telehealth: Payer: Self-pay | Admitting: Family Medicine

## 2017-04-27 ENCOUNTER — Ambulatory Visit
Admission: RE | Admit: 2017-04-27 | Discharge: 2017-04-27 | Disposition: A | Discharge status: Home / Self Care | Payer: BLUE CROSS/BLUE SHIELD | Source: Ambulatory Visit | Attending: Family Medicine | Admitting: Family Medicine

## 2017-04-27 ENCOUNTER — Ambulatory Visit: Payer: BLUE CROSS/BLUE SHIELD | Admitting: Family Medicine

## 2017-04-27 VITALS — BP 110/80 | HR 112 | Temp 98.5°F | Wt 154.6 lb

## 2017-04-27 DIAGNOSIS — R8290 Unspecified abnormal findings in urine: Secondary | ICD-10-CM | POA: Diagnosis not present

## 2017-04-27 DIAGNOSIS — R1031 Right lower quadrant pain: Secondary | ICD-10-CM | POA: Insufficient documentation

## 2017-04-27 DIAGNOSIS — M545 Low back pain: Secondary | ICD-10-CM | POA: Diagnosis not present

## 2017-04-27 LAB — POCT URINALYSIS DIPSTICK
Bilirubin, UA: NEGATIVE
Glucose, UA: NEGATIVE
Leukocytes, UA: NEGATIVE
NITRITE UA: NEGATIVE
PH UA: 6 (ref 5.0–8.0)
PROTEIN UA: NEGATIVE
Spec Grav, UA: 1.025 (ref 1.010–1.025)
UROBILINOGEN UA: 0.2 U/dL

## 2017-04-27 MED ORDER — IOPAMIDOL (ISOVUE-300) INJECTION 61%
100.0000 mL | Freq: Once | INTRAVENOUS | Status: AC | PRN
  Administered 2017-04-27: 100 mL via INTRAVENOUS

## 2017-04-27 NOTE — Progress Notes (Signed)
Marikay AlarEric Lucita Montoya, MD Phone: [phone]  Broadus Johnmanda R Thurlow is a 26 y.o. female who presents today for same-day visit.  Patient reports intermittently over the last month or so she has had a dull low back discomfort that is achy in nature.  She notes this sometimes occurs when her bladder acts up as she does have interstitial cystitis.  She notes over the last several days she has had fairly significant right lower quadrant discomfort that does get worse with eating though is constantly present.  She notes no dysuria.  Notes a little bit of blood in her urine though this has been an intermittent issue with her interstitial cystitis.  She notes on Friday she developed chills and hot flashes.  She has not had a documented fever.  She denies sexual activity with males.  She is in a relationship with a female though has not been sexually active.  No vaginal discharge.  No diarrhea.  No constipation.  No blood in her stool.  No melena. No prior surgical procedures.   Social History   Tobacco Use  Smoking Status Never Smoker  Smokeless Tobacco Never Used     ROS see history of present illness  Objective  Physical Exam Vitals:   04/27/17 1557  BP: 110/80  Pulse: (!) 112  Temp: 98.5 F (36.9 C)  SpO2: 98%    BP Readings from Last 3 Encounters:  04/27/17 110/80  03/23/17 128/78  03/01/17 98/72   Wt Readings from Last 3 Encounters:  04/27/17 154 lb 9.6 oz (70.1 kg)  03/23/17 150 lb 9.6 oz (68.3 kg)  03/01/17 148 lb 12.8 oz (67.5 kg)    Physical Exam  Constitutional: No distress.  Cardiovascular: Regular rhythm and normal heart sounds. Tachycardia present.  Pulmonary/Chest: Effort normal and breath sounds normal.  Abdominal: Soft. Bowel sounds are normal. She exhibits no distension. There is tenderness (RLQ). There is guarding (RLQ focal). There is no rebound.  Musculoskeletal: She exhibits no edema.  Neurological: She is alert. Gait normal.  Skin: Skin is warm and dry. She is not  diaphoretic.     Assessment/Plan: Please see individual problem list.  Right lower quadrant abdominal pain Patient with vague low back discomfort over the last month though has subsequently developed fairly significant right lower quadrant discomfort over the last several days.  Has had some chills and hot flashes with no significant change in her urinary symptoms.  Patient is tender with guarding in the right lower quadrant.  Concern would be for appendicitis versus other intra-abdominal infectious process given chills, tachycardia, and tenderness.  Less likely related to a kidney stone given lack of symptoms consistent with this.  Also tenderness would make this less likely.  We will obtain a CT abdomen and pelvis with contrast.  The patient notes that she could not be pregnant thus pregnancy test was deferred by patient.  We will send urine for culture and microscopy as well.  We will contact her when the CT scan returns.   [name] was seen today for back pain.  Diagnoses and all orders for this visit:  Right lower quadrant abdominal pain -     CT Abdomen Pelvis W Contrast; Future  Acute bilateral low back pain, with sciatica presence unspecified -     POCT Urinalysis Dipstick  Abnormal urinalysis -     Urine Culture -     Urine Microscopic Only    Orders Placed This Encounter  Procedures  . Urine Culture  . CT Abdomen  Pelvis W Contrast    Standing Status:   Future    Number of Occurrences:   1    Standing Expiration Date:   07/27/2018    Order Specific Question:   If indicated for the ordered procedure, I authorize the administration of contrast media per Radiology protocol    Answer:   Yes    Order Specific Question:   Is patient pregnant?    Answer:   No    Order Specific Question:   Preferred imaging location?    Answer:   [facility]    Order Specific Question:   Radiology Contrast Protocol - do NOT remove file path    Answer:    [email or web address]  . Urine Microscopic Only  . POCT Urinalysis Dipstick    No orders of the defined types were placed in this encounter.    Marikay AlarEric Sue Fernicola, MD [facility]

## 2017-04-27 NOTE — Telephone Encounter (Signed)
Received the cal report for the patients CT abd pelvis w contrast. Negative for cause of pain. Potentially could be related to her interstitial cystitis or UTI, though UA was not terribly compelling for UTI. Could also represent IBS as well. Discussed negative findings with patient on the phone. Advised we would wait on urine culture results and then determine management. She voiced understanding.

## 2017-04-27 NOTE — Assessment & Plan Note (Addendum)
Patient with vague low back discomfort over the last month though has subsequently developed fairly significant right lower quadrant discomfort over the last several days.  Has had some chills and hot flashes with no significant change in her urinary symptoms.  Patient is tender with guarding in the right lower quadrant.  Concern would be for appendicitis versus other intra-abdominal infectious process given chills, tachycardia, and tenderness.  Less likely related to a kidney stone given lack of symptoms consistent with this.  Also tenderness would make this less likely.  We will obtain a CT abdomen and pelvis with contrast.  The patient notes that she could not be pregnant thus pregnancy test was deferred by patient.  We will send urine for culture and microscopy as well.  We will contact her when the CT scan returns.

## 2017-04-27 NOTE — Patient Instructions (Signed)
Please go get the CT scan.  We will contact you with the results.

## 2017-04-28 LAB — URINALYSIS, MICROSCOPIC ONLY

## 2017-04-29 LAB — URINE CULTURE
MICRO NUMBER: 81427980
RESULT: NO GROWTH
SPECIMEN QUALITY: ADEQUATE

## 2017-06-03 ENCOUNTER — Encounter: Payer: Self-pay | Admitting: Family Medicine

## 2017-06-03 NOTE — Telephone Encounter (Signed)
Patient notified

## 2017-06-03 NOTE — Telephone Encounter (Signed)
Please contact the patient and let her know that since her symptoms have progressed she should be reevaluated.  If she is having current pain she should go to an urgent care or walk-in clinic.  If she has no current pain she needs to be set up with myself or her PCP.  Thanks.

## 2017-06-03 NOTE — Telephone Encounter (Signed)
Patient states she will go to urgent care today.  

## 2017-06-23 ENCOUNTER — Telehealth: Payer: Self-pay | Admitting: *Deleted

## 2017-06-23 NOTE — Telephone Encounter (Signed)
Copied from CRM [phone]. Topic: Bill or Statement - Patient/Guarantor Payment >> Jun 23, 2017 11:15 AM Crist InfanteHarrald, Evone Arseneau J wrote: Patient name/MRN/Acct  MRN:  [id number] DOS: 03/01/17 Additional Information: mom called and wants to know how pt paid for her appt on this date. They are having an issue with billing ([facility]) not showing her co pay. Mom also wanted to know her visit dates.  Advised mom she is not on a DPR and I cannot tell her any information about appt dates.  Mom asked then if you could send a mychart message to the pt telling her which card she paid for her 03/01/17 visit with. Mom says pt is busy working, mom trying to help her figure her bills out. [facility] and they do not have any copay for this date, and this has happened before. Route to appropriate pool.

## 2017-06-24 NOTE — Telephone Encounter (Signed)
The patient did not pay on copay on that DOS 10.22.18.

## 2017-06-25 NOTE — Telephone Encounter (Signed)
Patient notified

## 2017-07-29 ENCOUNTER — Telehealth: Payer: Self-pay

## 2017-07-29 ENCOUNTER — Ambulatory Visit: Payer: Self-pay | Admitting: Adult Health

## 2017-07-29 ENCOUNTER — Encounter: Payer: Self-pay | Admitting: Adult Health

## 2017-07-29 VITALS — BP 128/80 | HR 112 | Temp 98.8°F | Resp 16 | Ht 65.0 in | Wt 158.0 lb

## 2017-07-29 DIAGNOSIS — R000 Tachycardia, unspecified: Secondary | ICD-10-CM | POA: Insufficient documentation

## 2017-07-29 DIAGNOSIS — J019 Acute sinusitis, unspecified: Secondary | ICD-10-CM

## 2017-07-29 MED ORDER — AMOXICILLIN-POT CLAVULANATE 875-125 MG PO TABS: 1.0000 | ORAL_TABLET | Freq: Two times a day (BID) | ORAL | 0 refills | Status: DC

## 2017-07-29 NOTE — Telephone Encounter (Signed)
Copied from CRM [phone]. Topic: Appointment Scheduling - Scheduling Inquiry for Clinic >> Jul 29, 2017  2:34 PM Oneal GroutSebastian, Jennifer S wrote: Reason for CRM: Patient calling need to be seen by Dr Darrick Huntsmanullo within a week, was seen in [facility] and was told to follow up in one week with PCP due to elevated HR. Please advise

## 2017-07-29 NOTE — Progress Notes (Signed)
Subjective:     Patient ID: Shelby Terry, female   DOB: 09/19/1990, 27 y.o.   MRN: [id number]  HPI   Patient is a 27 year old female in no acute distress who reports for a couple of weeks she has had head congestion, mild headache above her eyes/ forehead x 2 days. Ear pain right ear.    Patient has had mild sweating and chills at home for past two days.  Non productive dry cough. Denies any recent illness.  She reports abdominal issues are better - noticed in  Problem list.   She reports her heart rate is always fast and she did wear a heart monitor - 49 hour monitor was normal per patient .  She reports last time she took decongestant was 2 one week ago. No caffeine today. Heart rate on arrival to office was 120 beats per minute after rest heart rate 112 beats per minute. She denies any chest pain or palpitations currently. Denies light headiness or dizziness. Denies any syncope.   Patient's last menstrual period was 04/30/2017.-has menstrual- no female intercourse per patient no chance of pregnancy.    Patient  denies any fever, body aches.  rash, chest pain, shortness of breath, nausea, vomiting, or diarrhea.   Blood pressure 128/80, pulse (!) 120, temperature 98.8 F (37.1 C), resp. rate 16, height 5\' 5"  (1.651 m), weight 158 lb (71.7 kg), last menstrual period 04/30/2017, SpO2 98 %.  Recheck heart rate as below after rest:  Today's Vitals   07/29/17 0145 07/29/17 1338  BP:  128/80  Pulse: (!) 112 (!) 120  Resp:  16  Temp:  98.8 F (37.1 C)  SpO2:  98%  Weight:  158 lb (71.7 kg)  Height:  5\' 5"  (1.651 m)   She denies any other concerns or symptoms at this time.   Current Outpatient Medications:  .  acetaminophen (TYLENOL) 325 MG tablet, Take 325 mg every 4 (four) hours as needed by mouth., Disp: , Rfl:  .  amitriptyline (ELAVIL) 25 MG tablet, Take 1 tablet (25 mg total) by mouth at bedtime., Disp: 90 tablet, Rfl: 1 .  DULoxetine (CYMBALTA) 30 MG capsule, Take 1 capsule (30  mg total) by mouth daily., Disp: 90 capsule, Rfl: 1 .  ferrous sulfate 325 (65 FE) MG tablet, Take 325 mg by mouth daily with breakfast., Disp: , Rfl:  .  Norethindrone Acetate-Ethinyl Estradiol (MICROGESTIN) 1.5-30 MG-MCG tablet, Take 1 tablet by mouth daily., Disp: 1 Package, Rfl: 11 .  triamcinolone cream (KENALOG) 0.1 %, Apply 1 application topically 2 (two) times daily., Disp: 30 g, Rfl: 2 .  vitamin B-12 (CYANOCOBALAMIN) 1000 MCG tablet, Take 1,000 mcg by mouth daily., Disp: , Rfl:  .  Vitamin D, Cholecalciferol, 400 units TABS, Take 1 tablet by mouth daily., Disp: , Rfl:   Patient Active Problem List   Diagnosis Date Noted  . Right lower quadrant abdominal pain 04/27/2017  . URI (upper respiratory infection) 03/26/2017  . Visit for preventive health examination 11/11/2012  . Menstrual cycle disorder 10/25/2012  . Migraine, chronic, without aura 10/25/2012  . Chronic interstitial cystitis 10/25/2012  . Palpitations 10/25/2012     Review of Systems  Constitutional: Positive for chills (occasional x 2 days. ) and fatigue (mild x 2 weeks ). Negative for activity change, appetite change, diaphoresis, fever and unexpected weight change.  HENT: Positive for congestion, ear pain (right ear ), postnasal drip, rhinorrhea and sinus pressure. Negative for dental problem, drooling, ear discharge,  facial swelling, hearing loss, mouth sores, nosebleeds, sinus pain, sneezing, sore throat, tinnitus, trouble swallowing and voice change.   Eyes: Negative.   Respiratory: Negative for apnea, cough, choking, chest tightness, shortness of breath, wheezing and stridor.   Cardiovascular: Negative for chest pain, palpitations and leg swelling.       Denies any chest pain or palpitations today or within the past month however she does report a long history of p(alp(iattions in the past.   Gastrointestinal: Negative.   Musculoskeletal: Negative.   Neurological: Negative.   Hematological: Negative.    Psychiatric/Behavioral: Negative.        Objective:   Physical Exam  Constitutional: She is oriented to person, place, and time. She appears well-developed and well-nourished. She is active.  Non-toxic appearance. She does not have a sickly appearance. She does not appear ill. No distress. She is not intubated.  Repeat heart rate with rest in office down to 112 beats per  Minute from 120 beats per minute.   HENT:  Head: Normocephalic and atraumatic.  Right Ear: Hearing, external ear and ear canal normal. No swelling. Tympanic membrane is erythematous (mildly pink right tympanic membrane). Tympanic membrane is not perforated. A middle ear effusion is present. No decreased hearing is noted.  Left Ear: Hearing, tympanic membrane, external ear and ear canal normal. No swelling. Tympanic membrane is not perforated and not erythematous. No decreased hearing is noted.  Nose: Mucosal edema and rhinorrhea present. Right sinus exhibits frontal sinus tenderness. Right sinus exhibits no maxillary sinus tenderness. Left sinus exhibits frontal sinus tenderness. Left sinus exhibits no maxillary sinus tenderness.  Mouth/Throat: Uvula is midline and mucous membranes are normal. No uvula swelling. Posterior oropharyngeal erythema present. No oropharyngeal exudate, posterior oropharyngeal edema or tonsillar abscesses.  Right tonsil 1+   Eyes: Pupils are equal, round, and reactive to light. Conjunctivae and EOM are normal. Right eye exhibits no discharge. Left eye exhibits no discharge. No scleral icterus.  Neck: Trachea normal, normal range of motion, full passive range of motion without pain and phonation normal. Neck supple. Normal carotid pulses, no hepatojugular reflux and no JVD present. No tracheal tenderness present. Carotid bruit is not present. No neck rigidity. No tracheal deviation, no edema, no erythema and normal range of motion present. No Brudzinski's sign noted. No thyroid mass and no thyromegaly  present.  Cardiovascular: Regular rhythm, normal heart sounds and intact distal pulses. Tachycardia present. Exam reveals no gallop, no S3, no S4 and no friction rub.  No murmur heard. Denies any swelling in any extremities.   Pulmonary/Chest: Effort normal and breath sounds normal. No stridor. No apnea, no tachypnea and no bradypnea. She is not intubated. No respiratory distress. She has no wheezes. She has no rales. She exhibits no tenderness.  Abdominal: Soft. Bowel sounds are normal.  Musculoskeletal: Normal range of motion.  Lymphadenopathy:    She has no cervical adenopathy.    She has no axillary adenopathy.  Neurological: She is alert and oriented to person, place, and time. She has normal strength. She displays normal reflexes. No cranial nerve deficit. She exhibits normal muscle tone. Coordination normal.  Skin: Skin is warm, dry and intact. No rash noted. She is not diaphoretic. No cyanosis or erythema. No pallor. Nails show no clubbing.  Psychiatric: She has a normal mood and affect. Her speech is normal and behavior is normal. Judgment and thought content normal. Cognition and memory are normal.  Vitals reviewed.      Assessment:  She denies allergy to penicillins has taken Augmentin/ Penicillin and Amoxicillin al;l orally in past without any problem or reaction and reports she had a " localized reaction to the needle " at the time allergy was documented.  She has Benadryl with her at all times because she is allergic to Aspirin and Ibuprofen.   Acute non-recurrent sinusitis, unspecified location  Tachycardia - Patient reports she has had for years      Plan:      Meds ordered this encounter  Medications  . amoxicillin-clavulanate (AUGMENTIN) 875-125 MG tablet    Sig: Take 1 tablet by mouth 2 (two) times daily.    Dispense:  20 tablet    Refill:  0    Patient denies allergy - has had before - has localized reaction to needle with injections only.   Avoid caffeine/  decongestants.     Advised to call primary care MD Sherlene Shams, MD Today given longstanding tachycardia and to be seen within one week. Advised to Call 911 should she have any new symptoms or worsening symptoms. No strenuous exercise.    Return to clinic at any time  if any new symptoms change, worsen or do not improve. Symptoms should improve  within 72 hours and if not improving you should call for an appointment at the clinic or if worsening symptoms be seen in urgent care/emergency room  if clinic is closed. If any emergent symptoms call 911 at anytime. Patient verbalized above understanding of all instructions and denies any further questions at this time.

## 2017-07-29 NOTE — Patient Instructions (Signed)
Follow  Up with Primary care MD within one week- call today given history of elevated heart rate for " years". If you become symptomatic Call 911.   Avoid decongestants.  Use only nasal saline purchased at your pharmacy and plain Mucinex as needed per package instructions.   Sinusitis, Adult Sinusitis is soreness and inflammation of your sinuses. Sinuses are hollow spaces in the bones around your face. They are located:  Around your eyes.  In the middle of your forehead.  Behind your nose.  In your cheekbones.  Your sinuses and nasal passages are lined with a stringy fluid (mucus). Mucus normally drains out of your sinuses. When your nasal tissues get inflamed or swollen, the mucus can get trapped or blocked so air cannot flow through your sinuses. This lets bacteria, viruses, and funguses grow, and that leads to infection. Follow these instructions at home: Medicines  Take, use, or apply over-the-counter and prescription medicines only as told by your doctor. These may include nasal sprays.  If you were prescribed an antibiotic medicine, take it as told by your doctor. Do not stop taking the antibiotic even if you start to feel better. Hydrate and Humidify  Drink enough water to keep your pee (urine) clear or pale yellow.  Use a cool mist humidifier to keep the humidity level in your home above 50%.  Breathe in steam for 10-15 minutes, 3-4 times a day or as told by your doctor. You can do this in the bathroom while a hot shower is running.  Try not to spend time in cool or dry air. Rest  Rest as much as possible.  Sleep with your head raised (elevated).  Make sure to get enough sleep each night. General instructions  Put a warm, moist washcloth on your face 3-4 times a day or as told by your doctor. This will help with discomfort.  Wash your hands often with soap and water. If there is no soap and water, use hand sanitizer.  Do not smoke. Avoid being around people who  are smoking (secondhand smoke).  Keep all follow-up visits as told by your doctor. This is important. Contact a doctor if:  You have a fever.  Your symptoms get worse.  Your symptoms do not get better within 10 days. Get help right away if:  You have a very bad headache.  You cannot stop throwing up (vomiting).  You have pain or swelling around your face or eyes.  You have trouble seeing.  You feel confused.  Your neck is stiff.  You have trouble breathing. This information is not intended to replace advice given to you by your health care provider. Make sure you discuss any questions you have with your health care provider. Document Released: 10/14/2007 Document Revised: 12/22/2015 Document Reviewed: 02/20/2015 Elsevier Interactive Patient Education  [employer]2018 Elsevier Inc.

## 2017-07-30 ENCOUNTER — Ambulatory Visit: Payer: Self-pay | Admitting: Adult Health

## 2017-07-30 NOTE — Telephone Encounter (Signed)
Would it be okay to schedule pt for 11:30 next Monday or 11:30 next Friday?

## 2017-07-30 NOTE — Telephone Encounter (Signed)
Yes, ok  To schedule her

## 2017-07-30 NOTE — Telephone Encounter (Signed)
Pt called to f/u on status of appt.

## 2017-08-02 NOTE — Telephone Encounter (Signed)
Called and confirmed the appt with pt and she is okay with coming in at 11:30am on Friday.

## 2017-08-02 NOTE — Telephone Encounter (Signed)
LMTCB. Need to find out if pt can come in for an appt on Friday 08/06/2017 @ 11:30am with Dr. Darrick Huntsmanullo. PEC may speak with pt.

## 2017-08-02 NOTE — Telephone Encounter (Signed)
Patient says she can come in on Friday 03/29 at 11:30am to see Dr. Darrick Huntsmanullo. Please call her back to finalize the work in on the providers schedule.

## 2017-08-06 ENCOUNTER — Ambulatory Visit: Payer: BLUE CROSS/BLUE SHIELD | Admitting: Internal Medicine

## 2017-08-06 VITALS — BP 118/84 | HR 105 | Temp 98.7°F | Resp 18 | Wt 154.0 lb

## 2017-08-06 DIAGNOSIS — D509 Iron deficiency anemia, unspecified: Secondary | ICD-10-CM

## 2017-08-06 DIAGNOSIS — R000 Tachycardia, unspecified: Secondary | ICD-10-CM

## 2017-08-06 DIAGNOSIS — K625 Hemorrhage of anus and rectum: Secondary | ICD-10-CM

## 2017-08-06 DIAGNOSIS — K580 Irritable bowel syndrome with diarrhea: Secondary | ICD-10-CM | POA: Diagnosis not present

## 2017-08-06 DIAGNOSIS — Z13220 Encounter for screening for lipoid disorders: Secondary | ICD-10-CM

## 2017-08-06 DIAGNOSIS — R002 Palpitations: Secondary | ICD-10-CM

## 2017-08-06 DIAGNOSIS — M797 Fibromyalgia: Secondary | ICD-10-CM

## 2017-08-06 DIAGNOSIS — K921 Melena: Secondary | ICD-10-CM

## 2017-08-06 LAB — CBC WITH DIFFERENTIAL/PLATELET
BASOS PCT: 0.4 % (ref 0.0–3.0)
Basophils Absolute: 0 10*3/uL (ref 0.0–0.1)
EOS ABS: 0.1 10*3/uL (ref 0.0–0.7)
Eosinophils Relative: 1.9 % (ref 0.0–5.0)
HCT: 38.7 % (ref 36.0–46.0)
Hemoglobin: 13 g/dL (ref 12.0–15.0)
LYMPHS ABS: 2 10*3/uL (ref 0.7–4.0)
Lymphocytes Relative: 34.4 % (ref 12.0–46.0)
MCHC: 33.5 g/dL (ref 30.0–36.0)
MCV: 85.7 fl (ref 78.0–100.0)
MONO ABS: 0.4 10*3/uL (ref 0.1–1.0)
Monocytes Relative: 6.1 % (ref 3.0–12.0)
NEUTROS ABS: 3.4 10*3/uL (ref 1.4–7.7)
Neutrophils Relative %: 57.2 % (ref 43.0–77.0)
PLATELETS: 395 10*3/uL (ref 150.0–400.0)
RBC: 4.51 Mil/uL (ref 3.87–5.11)
RDW: 13 % (ref 11.5–15.5)
WBC: 5.9 10*3/uL (ref 4.0–10.5)

## 2017-08-06 LAB — IRON,TIBC AND FERRITIN PANEL
%SAT: 15 % (ref 11–50)
Ferritin: 108 ng/mL (ref 10–154)
Iron: 70 ug/dL (ref 40–190)
TIBC: 455 ug/dL — AB (ref 250–450)

## 2017-08-06 LAB — LIPID PANEL
CHOL/HDL RATIO: 6
CHOLESTEROL: 242 mg/dL — AB (ref 0–200)
HDL: 42.8 mg/dL (ref >39.00)
NonHDL: 199.68
TRIGLYCERIDES: 296 mg/dL — AB (ref 0.0–149.0)
VLDL: 59.2 mg/dL — ABNORMAL HIGH (ref 0.0–40.0)

## 2017-08-06 LAB — LDL CHOLESTEROL, DIRECT: Direct LDL: 168 mg/dL

## 2017-08-06 LAB — TSH: TSH: 3.49 u[IU]/mL (ref 0.35–4.50)

## 2017-08-06 MED ORDER — DULOXETINE HCL 40 MG PO CPEP: 40.0000 mg | ORAL_CAPSULE | Freq: Every day | ORAL | 1 refills | Status: DC

## 2017-08-06 NOTE — Patient Instructions (Addendum)
The following meds are avaialble otc  For management of of hemorrhoids /rectal pain   anusol HC  Nupercainal   Pramoxine (Proctofoam)    I will make a Referral to Dr Allegra LaiVanga for colonoscopy   For your fibromyalgia:  I suggest we adjust your doses of cymbalta and amitriptyline first  Before changing medications  I have increased the cymbalta to 40 mg daily,  And you can increaes the amitriptyline to 50 mg after 2 weeks if the symptoms are worse at night,  Otherwise we can increase the cymbalta to 60 mg if needed

## 2017-08-06 NOTE — Progress Notes (Signed)
Subjective:  Patient ID: Shelby Terry, female    DOB: Dec 06, 1990  Age: 27 y.o. MRN: [id number]  CC: The primary encounter diagnosis was Palpitations. Diagnoses of Iron deficiency anemia, unspecified iron deficiency anemia type, Screening for hypercholesterolemia, Irritable bowel syndrome with diarrhea, Hematochezia, Tachycardia, Rectal bleeding, and Fibromyalgia were also pertinent to this visit.  HPI Shelby Terry presents for follow up   She was treated  for sinusitis last Thursday with augmentin x 10 days, no steroids. Developed a fever of 102 on Day 5 of abx,  But resolved in < 24 hours. Has been taking a probiotic but has been having diarrhea  since starting the abx,   Stools are soft but not watery and occurring 2-3 times daily.   Since December she has been having blood in her stool, .  The bleeding stopped for a month. No abdominal cramping. Occurs between periods,  Has a typical 5 day menstrual period. Has an external hemorrhoid. Discussed referral to Dr Allegra Lai for colonoscopy.   She was sent here by [facility] for evaluation of  asymptomatic tachycardia.  It does not limit her activity or exercise tolerance. Patient has a long history of tachycardia.  She was evaluated in 2014 for palpitations  With EKG thyroid and other labs, all of which were normal.   Fibromyalgia:  Her symptoms  For the last several months have been intermittently uncontrolled  There are some days she can barely get out of bed due to diffuse pain.      Outpatient Medications Prior to Visit  Medication Sig Dispense Refill  . acetaminophen (TYLENOL) 325 MG tablet Take 325 mg every 4 (four) hours as needed by mouth.    Marland Kitchen amitriptyline (ELAVIL) 25 MG tablet Take 1 tablet (25 mg total) by mouth at bedtime. 90 tablet 1  . amoxicillin-clavulanate (AUGMENTIN) 875-125 MG tablet Take 1 tablet by mouth 2 (two) times daily. 20 tablet 0  . ferrous sulfate 325 (65 FE) MG tablet Take 325 mg by mouth daily  with breakfast.    . Norethindrone Acetate-Ethinyl Estradiol (MICROGESTIN) 1.5-30 MG-MCG tablet Take 1 tablet by mouth daily. 1 Package 11  . triamcinolone cream (KENALOG) 0.1 % Apply 1 application topically 2 (two) times daily. 30 g 2  . vitamin B-12 (CYANOCOBALAMIN) 1000 MCG tablet Take 1,000 mcg by mouth daily.    . Vitamin D, Cholecalciferol, 400 units TABS Take 1 tablet by mouth daily.    . DULoxetine (CYMBALTA) 30 MG capsule Take 1 capsule (30 mg total) by mouth daily. 90 capsule 1   No facility-administered medications prior to visit.     Review of Systems;  Patient denies headache, fevers, malaise, unintentional weight loss, skin rash, eye pain, sinus congestion and sinus pain, sore throat, dysphagia,  hemoptysis , cough, dyspnea, wheezing, chest pain, palpitations, orthopnea, edema, abdominal pain, nausea, melena,  constipation, flank pain, dysuria, hematuria, urinary  Frequency, nocturia, numbness, tingling, seizures,  Focal weakness, Loss of consciousness,  Tremor, insomnia, depression, anxiety, and suicidal ideation.      Objective:  BP 118/84 (BP Location: Left Arm, Patient Position: Sitting, Cuff Size: Normal)   Pulse (!) 105   Temp 98.7 F (37.1 C) (Oral)   Resp 18   Wt 154 lb (69.9 kg)   SpO2 98%   BMI 25.63 kg/m   BP Readings from Last 3 Encounters:  08/06/17 118/84  07/29/17 128/80  04/27/17 110/80    Wt Readings from Last 3 Encounters:  08/06/17 154  lb (69.9 kg)  07/29/17 158 lb (71.7 kg)  04/27/17 154 lb 9.6 oz (70.1 kg)    General appearance: alert, cooperative and appears stated age Ears: normal TM's and external ear canals both ears Throat: lips, mucosa, and tongue normal; teeth and gums normal Neck: no adenopathy, no carotid bruit, supple, symmetrical, trachea midline and thyroid not enlarged, symmetric, no tenderness/mass/nodules Back: symmetric, no curvature. ROM normal. No CVA tenderness. Lungs: clear to auscultation bilaterally Heart: regular  rate and rhythm, S1, S2 normal, no murmur, click, rub or gallop Abdomen: soft, non-tender; bowel sounds normal; no masses,  no organomegaly Pulses: 2+ and symmetric Skin: Skin color, texture, turgor normal. No rashes or lesions Lymph nodes: Cervical, supraclavicular, and axillary nodes normal.  No results found for: HGBA1C  Lab Results  Component Value Date   CREATININE 0.7 10/25/2012    Lab Results  Component Value Date   WBC 5.9 08/06/2017   HGB 13.0 08/06/2017   HCT 38.7 08/06/2017   PLT 395.0 08/06/2017   GLUCOSE 84 10/25/2012   CHOL 242 (H) 08/06/2017   TRIG 296.0 (H) 08/06/2017   HDL 42.80 08/06/2017   LDLDIRECT 168.0 08/06/2017   ALT 18 10/25/2012   AST 23 10/25/2012   NA 138 10/25/2012   K 4.0 10/25/2012   CL 103 10/25/2012   CREATININE 0.7 10/25/2012   BUN 9 10/25/2012   CO2 19 10/25/2012   TSH 3.49 08/06/2017    Ct Abdomen Pelvis W Contrast  Result Date: 04/27/2017 CLINICAL DATA:  Right lower abdominal and back pain EXAM: CT ABDOMEN AND PELVIS WITH CONTRAST TECHNIQUE: Multidetector CT imaging of the abdomen and pelvis was performed using the standard protocol following bolus administration of intravenous contrast. CONTRAST:  100mL ISOVUE-300 IOPAMIDOL (ISOVUE-300) INJECTION 61% COMPARISON:  None. FINDINGS: Lower chest: No acute abnormality. Hepatobiliary: No focal liver abnormality is seen. No gallstones, gallbladder wall thickening, or biliary dilatation. Pancreas: Unremarkable. No pancreatic ductal dilatation or surrounding inflammatory changes. Spleen: Normal in size without focal abnormality. Adrenals/Urinary Tract: Adrenal glands are unremarkable. Kidneys are normal, without renal calculi, focal lesion, or hydronephrosis. Bladder is unremarkable. Stomach/Bowel: Stomach is within normal limits. Appendix appears normal. No evidence of bowel wall thickening, distention, or inflammatory changes. Vascular/Lymphatic: No significant vascular findings are present. No  enlarged abdominal or pelvic lymph nodes. Reproductive: Uterus and bilateral adnexa are unremarkable. Other: Negative for free air or free fluid. Tiny fat in the umbilicus. Musculoskeletal: No acute or significant osseous findings. IMPRESSION: Negative. No CT evidence for acute intra-abdominal or pelvic abnormality. Electronically Signed   By: Jasmine PangKim  Fujinaga M.D.   On: 04/27/2017 19:55    Assessment & Plan:   Problem List Items Addressed This Visit    Palpitations - Primary   Relevant Orders   EKG 12-Lead (Completed)   TSH (Completed)   Tachycardia (Chronic)    Asymptomatic.  EKG done today is NSR and unchanged from 2014.  Repeat screening labs were normal.   Lab Results  Component Value Date   TSH 3.49 08/06/2017   Lab Results  Component Value Date   NA 138 10/25/2012   K 4.0 10/25/2012   CL 103 10/25/2012   CO2 19 10/25/2012   Lab Results  Component Value Date   WBC 5.9 08/06/2017   HGB 13.0 08/06/2017   HCT 38.7 08/06/2017   MCV 85.7 08/06/2017   PLT 395.0 08/06/2017         Rectal bleeding    Trial of treatment for bleeding external hemorrhoids.  She has a history of IBS with no prior colonoscopy.  Referral to [facility]      Fibromyalgia    Other Visit Diagnoses    Iron deficiency anemia, unspecified iron deficiency anemia type       Relevant Orders   CBC with Differential/Platelet (Completed)   Iron, TIBC and Ferritin Panel (Completed)   Screening for hypercholesterolemia       Relevant Orders   Lipid panel (Completed)   Irritable bowel syndrome with diarrhea       Relevant Orders   Ambulatory referral to Gastroenterology   Hematochezia       Relevant Orders   Ambulatory referral to Gastroenterology     A total of 25 minutes of face to face time was spent with patient more than half of which was spent in counselling about the above mentioned conditions  and coordination of care   I have changed Maelys R. Hanak's DULoxetine to DULoxetine HCl. I am  also having her maintain her triamcinolone cream, amitriptyline, ferrous sulfate, vitamin B-12, Vitamin D (Cholecalciferol), Norethindrone Acetate-Ethinyl Estradiol, acetaminophen, and amoxicillin-clavulanate.  Meds ordered this encounter  Medications  . DULoxetine 40 MG CPEP    Sig: Take 40 mg by mouth daily.    Dispense:  90 capsule    Refill:  1    Medications Discontinued During This Encounter  Medication Reason  . DULoxetine (CYMBALTA) 30 MG capsule     Follow-up: Return in about 6 months (around 02/06/2018), or IBS,  fibromyalgia .   Sherlene Shams, MD

## 2017-08-08 ENCOUNTER — Encounter: Payer: Self-pay | Admitting: Internal Medicine

## 2017-08-08 ENCOUNTER — Telehealth: Payer: Self-pay | Admitting: Internal Medicine

## 2017-08-08 DIAGNOSIS — K625 Hemorrhage of anus and rectum: Secondary | ICD-10-CM | POA: Insufficient documentation

## 2017-08-08 DIAGNOSIS — M797 Fibromyalgia: Secondary | ICD-10-CM | POA: Insufficient documentation

## 2017-08-08 NOTE — Assessment & Plan Note (Signed)
Asymptomatic.  EKG done today is NSR and unchanged from 2014.  Repeat screening labs were normal.   Lab Results  Component Value Date   TSH 3.49 08/06/2017   Lab Results  Component Value Date   NA 138 10/25/2012   K 4.0 10/25/2012   CL 103 10/25/2012   CO2 19 10/25/2012   Lab Results  Component Value Date   WBC 5.9 08/06/2017   HGB 13.0 08/06/2017   HCT 38.7 08/06/2017   MCV 85.7 08/06/2017   PLT 395.0 08/06/2017

## 2017-08-08 NOTE — Assessment & Plan Note (Signed)
Trial of treatment for bleeding external hemorrhoids.  She has a history of IBS with no prior colonoscopy.  Referral to [facility]

## 2017-08-12 ENCOUNTER — Ambulatory Visit (INDEPENDENT_AMBULATORY_CARE_PROVIDER_SITE_OTHER): Payer: BLUE CROSS/BLUE SHIELD | Admitting: Gastroenterology

## 2017-08-12 ENCOUNTER — Encounter: Payer: Self-pay | Admitting: Gastroenterology

## 2017-08-12 VITALS — BP 116/83 | HR 92 | Temp 98.1°F | Ht 65.0 in | Wt 152.2 lb

## 2017-08-12 DIAGNOSIS — K580 Irritable bowel syndrome with diarrhea: Secondary | ICD-10-CM | POA: Diagnosis not present

## 2017-08-12 DIAGNOSIS — K529 Noninfective gastroenteritis and colitis, unspecified: Secondary | ICD-10-CM | POA: Diagnosis not present

## 2017-08-12 DIAGNOSIS — D508 Other iron deficiency anemias: Secondary | ICD-10-CM

## 2017-08-12 NOTE — Progress Notes (Signed)
Shelby Repressohini R [name], MD [address]  [address]  [place], [place] [address]  Main: [phone]  Fax: [phone]    Gastroenterology Consultation  Referring Provider:     Sherlene Shamsullo, Teresa L, MD Primary Care Physician:  Sherlene Shamsullo, Teresa L, MD Primary Gastroenterologist:  Dr. Arlyss Repressohini R Akai Dollard Reason for Consultation:     IBS diarrhea        HPI:   Shelby Terry is a 27 y.o. female referred by Dr. Sherlene Shamsullo, Teresa L, MD  for consultation & management of diarrhea predominant IBS. Patient has history of fibromyalgia,migraines, endometriosis, anxiety, interstitial cystitis who is diagnosed with diarrhea predominant irritable bowel syndrome and she was in middle school. She said she has been on amitriptyline 25 mg since then. She was in [place], moved to [place] in October, [place]. Currently working in [employer]. She reports that her IBS symptoms were fairly under control up until 5 years ago. Since then she has been experiencing frequent bowel movements up to 5 times a day, loose, nonbloody. However, since October she has been noticing blood mixed with stools intermittently, which has slightly reduced since January, 2019. She continues to have frequent bowel movements, during the day. She denies urgency or nocturnal diarrhea or incontinence. She reports intermittent abdominal cramps and mild abdominal distention. She reports that stress plays a significant role in worsening of her diarrhea. She finds her job to be stressful, but less than her previous job. She is found to have elevated cholesterol and she is on low glycemic index diet. She also has history of iron deficiency anemia which was thought to be secondary to menorrhagia, her menstrual cycles are currently regulated on hormone replacement therapy. She is taking one iron pill daily for the last 3 years. Her ferritin levels were 8 in 03/2014, most recently 108 in 07/2017. Her hemoglobin is normal with normal MCV. TSH normal. She is referred here  for consultation of worsening of diarrhea associated with blood. She denies weight loss, loss of appetite, fever, chills, other upper GI symptoms.  She does not smoke or drink alcohol, denies illicit drug use She is currently in a relationship, single  NSAIDs: none  Antiplts/Anticoagulants/Anti thrombotics: none  GI Procedures: none She denies GI surgeries She denies family history of inflammatory bowel disease, celiac disease,GI malignancy  Past Medical History:  Diagnosis Date  . Allergy   . Anxiety   . Chicken pox 1994  . Depression   . Fibromyalgia   . Interstitial cystitis   . Irritable bowel syndrome y-8  . Migraines 2008    History reviewed. No pertinent surgical history.   Current Outpatient Medications:  .  acetaminophen (TYLENOL) 325 MG tablet, Take 325 mg every 4 (four) hours as needed by mouth., Disp: , Rfl:  .  amitriptyline (ELAVIL) 25 MG tablet, Take 1 tablet (25 mg total) by mouth at bedtime., Disp: 90 tablet, Rfl: 1 .  DULoxetine 40 MG CPEP, Take 40 mg by mouth daily., Disp: 90 capsule, Rfl: 1 .  ferrous sulfate 325 (65 FE) MG tablet, Take 325 mg by mouth daily with breakfast., Disp: , Rfl:  .  Norethindrone Acetate-Ethinyl Estradiol (MICROGESTIN) 1.5-30 MG-MCG tablet, Take 1 tablet by mouth daily., Disp: 1 Package, Rfl: 11 .  triamcinolone cream (KENALOG) 0.1 %, Apply 1 application topically 2 (two) times daily., Disp: 30 g, Rfl: 2 .  vitamin B-12 (CYANOCOBALAMIN) 1000 MCG tablet, Take 1,000 mcg by mouth daily., Disp: , Rfl:  .  Vitamin D, Cholecalciferol, 400  units TABS, Take 1 tablet by mouth daily., Disp: , Rfl:    Family History  Problem Relation Age of Onset  . Hyperlipidemia Mother   . Hyperlipidemia Father   . Hypertension Father   . Diabetes Father   . Colon polyps Father   . Cervical cancer Maternal Grandmother   . Hypertension Maternal Grandfather   . Hyperlipidemia Maternal Grandfather   . Colon polyps Maternal Grandfather   . Diabetes  Maternal Grandfather   . Breast cancer Paternal Grandmother   . Lung cancer Paternal Grandfather   . Emphysema Paternal Grandfather   . Prostate cancer Maternal Uncle   . Breast cancer Paternal Aunt   . Diabetes Maternal Uncle   . Liver disease Maternal Uncle        x 2     Social History   Tobacco Use  . Smoking status: Never Smoker  . Smokeless tobacco: Never Used  Substance Use Topics  . Alcohol use: Yes    Alcohol/week: 0.0 oz    Comment: 1 per day  . Drug use: No    Allergies as of 08/12/2017 - Review Complete 08/12/2017  Allergen Reaction Noted  . Aspirin Swelling 10/25/2012  . Ibuprofen Swelling 10/25/2012  . Penicillins Itching and Swelling 04/04/2014    Review of Systems:    All systems reviewed and negative except where noted in HPI.   Physical Exam:  BP 116/83   Pulse 92   Temp 98.1 F (36.7 C) (Oral)   Ht 5\' 5"  (1.651 m)   Wt 152 lb 3.2 oz (69 kg)   BMI 25.33 kg/m  No LMP recorded. (Menstrual status: Oral contraceptives).  General:   Alert,  Well-developed, well-nourished, pleasant and cooperative in NAD Head:  Normocephalic and atraumatic. Eyes:  Sclera clear, no icterus.   Conjunctiva pink. Ears:  Normal auditory acuity. Nose:  No deformity, discharge, or lesions. Mouth:  No deformity or lesions,oropharynx pink & moist. Neck:  Supple; no masses or thyromegaly. Lungs:  Respirations even and unlabored.  Clear throughout to auscultation.   No wheezes, crackles, or rhonchi. No acute distress. Heart:  Regular rate and rhythm; no murmurs, clicks, rubs, or gallops. Abdomen:  Normal bowel sounds. Soft, non-tender and non-distended without masses, hepatosplenomegaly or hernias noted.  No guarding or rebound tenderness.   Rectal: Not performed Msk:  Symmetrical without gross deformities. Good, equal movement & strength bilaterally. Pulses:  Normal pulses noted. Extremities:  No clubbing or edema.  No cyanosis. Neurologic:  Alert and oriented x3;  grossly  normal neurologically. Skin:  Intact without significant lesions or rashes. No jaundice. Lymph Nodes:  No significant cervical adenopathy. Psych:  Alert and cooperative. Normal mood and affect.  Imaging Studies: Reviewed  Assessment and Plan:   LIYAH HIGHAM is a 27 y.o. Caucasian female with diagnosis of diarrhea predominant irritable bowel syndrome, maintained on amitriptyline 25 mg daily presents with approximately 5 years history of worsening of symptoms with recent flareup since October 2018 after she moved to [place]. Also, associated with intermittent blood in the stools.  - Recommend stool studies to evaluate for infection - Recommend celiac serologies - Recommend CRP, fecal calprotectin - Increase amitriptyline to 50 mg at bedtime - Trial of IB guard - Recommend to stop oral iron supplementation as her iron deficiency resolved - Will perform endoscopic evaluation based on the above workup   Follow up in 4 weeks   Shelby Repress, MD

## 2017-09-15 ENCOUNTER — Ambulatory Visit: Payer: BLUE CROSS/BLUE SHIELD | Admitting: Gastroenterology

## 2017-10-27 ENCOUNTER — Ambulatory Visit: Payer: BLUE CROSS/BLUE SHIELD | Admitting: Gastroenterology

## 2017-12-06 ENCOUNTER — Other Ambulatory Visit: Payer: Self-pay | Admitting: Internal Medicine

## 2017-12-10 ENCOUNTER — Telehealth: Payer: Self-pay | Admitting: *Deleted

## 2017-12-10 ENCOUNTER — Encounter: Payer: Self-pay | Admitting: Internal Medicine

## 2017-12-10 MED ORDER — AMITRIPTYLINE HCL 25 MG PO TABS: 25.0000 mg | ORAL_TABLET | Freq: Every day | ORAL | 0 refills | Status: DC

## 2017-12-10 NOTE — Telephone Encounter (Signed)
Patient is taking Cymbalta stated PCP advise okay at visit 3/19.

## 2017-12-10 NOTE — Telephone Encounter (Signed)
Patient requesting refill fo amitriptyline 25 mg she takes daily, last filled by historical MD when patient moved out of town , per PP last note 3/19 PCP was having patient remain on amitriptyline ,patient took last pill today,per conversation with MD I have forwarded for advisement to refill.

## 2017-12-10 NOTE — Telephone Encounter (Signed)
I am happy to refill for a short term until Dr Darrick Huntsmanullo is back in the office. Please confirm with the patient whether or not she is taking cymbalta in addition to the amitriptyline.

## 2017-12-17 ENCOUNTER — Other Ambulatory Visit: Payer: Self-pay | Admitting: Internal Medicine

## 2017-12-17 MED ORDER — AMITRIPTYLINE HCL 25 MG PO TABS: 25.0000 mg | ORAL_TABLET | Freq: Every day | ORAL | 0 refills | Status: DC

## 2017-12-17 NOTE — Telephone Encounter (Signed)
Copied from CRM [id number]. Topic: Quick Communication - Rx Refill/Question >> Dec 17, 2017  2:21 PM Lorrine KinMcGee, Harriette Tovey B, [place] wrote: **Patient states that Dr Birdie SonsSonnenberg sent her 5 pills to last until Dr Darrick Huntsmanullo was back in the office. States that she took her last pill yesterday and was wanting to know if Dr Darrick Huntsmanullo could send enough medication to last until her 02/02/18 med refill appointment?**  Medication: amitriptyline (ELAVIL) 25 MG tablet  Has the patient contacted their pharmacy? Yes.   (Agent: If no, request that the patient contact the pharmacy for the refill.) (Agent: If yes, when and what did the pharmacy advise?)  Preferred Pharmacy (with phone number or street name): [id number] - [place], [place] - [address]  Agent: Please be advised that RX refills may take up to 3 business days. We ask that you follow-up with your pharmacy.

## 2017-12-17 NOTE — Telephone Encounter (Signed)
LOV 08/06/17 Dr. Darrick Huntsmanullo Last refill 12/10/17 # 5 with 0 refill [facility] [address] [place]

## 2018-02-02 ENCOUNTER — Other Ambulatory Visit (HOSPITAL_COMMUNITY)
Admission: RE | Admit: 2018-02-02 | Discharge: 2018-02-02 | Disposition: A | Discharge status: Home / Self Care | Payer: BLUE CROSS/BLUE SHIELD | Source: Ambulatory Visit | Attending: Internal Medicine | Admitting: Internal Medicine

## 2018-02-02 ENCOUNTER — Encounter: Payer: Self-pay | Admitting: Internal Medicine

## 2018-02-02 ENCOUNTER — Ambulatory Visit (INDEPENDENT_AMBULATORY_CARE_PROVIDER_SITE_OTHER): Payer: BLUE CROSS/BLUE SHIELD | Admitting: Internal Medicine

## 2018-02-02 VITALS — BP 100/72 | HR 108 | Temp 98.0°F | Resp 14 | Ht 65.0 in | Wt 149.4 lb

## 2018-02-02 DIAGNOSIS — K580 Irritable bowel syndrome with diarrhea: Secondary | ICD-10-CM | POA: Diagnosis not present

## 2018-02-02 DIAGNOSIS — Z124 Encounter for screening for malignant neoplasm of cervix: Secondary | ICD-10-CM | POA: Diagnosis not present

## 2018-02-02 DIAGNOSIS — R5383 Other fatigue: Secondary | ICD-10-CM

## 2018-02-02 DIAGNOSIS — N926 Irregular menstruation, unspecified: Secondary | ICD-10-CM

## 2018-02-02 DIAGNOSIS — R000 Tachycardia, unspecified: Secondary | ICD-10-CM

## 2018-02-02 DIAGNOSIS — Z23 Encounter for immunization: Secondary | ICD-10-CM

## 2018-02-02 DIAGNOSIS — M797 Fibromyalgia: Secondary | ICD-10-CM

## 2018-02-02 DIAGNOSIS — N3010 Interstitial cystitis (chronic) without hematuria: Secondary | ICD-10-CM

## 2018-02-02 DIAGNOSIS — Z0000 Encounter for general adult medical examination without abnormal findings: Secondary | ICD-10-CM

## 2018-02-02 DIAGNOSIS — K589 Irritable bowel syndrome without diarrhea: Secondary | ICD-10-CM | POA: Insufficient documentation

## 2018-02-02 MED ORDER — AMITRIPTYLINE HCL 25 MG PO TABS: 25.0000 mg | ORAL_TABLET | Freq: Every day | ORAL | 2 refills | Status: DC

## 2018-02-02 MED ORDER — NORETHINDRONE ACET-ETHINYL EST 1.5-30 MG-MCG PO TABS: 1.0000 | ORAL_TABLET | Freq: Every day | ORAL | 3 refills | Status: DC

## 2018-02-02 MED ORDER — DULOXETINE HCL 40 MG PO CPEP: 40.0000 mg | ORAL_CAPSULE | Freq: Every day | ORAL | 1 refills | Status: DC

## 2018-02-02 NOTE — Assessment & Plan Note (Signed)
Chronic with prior EKG normal .  No further workup as she is asymptomatic

## 2018-02-02 NOTE — Assessment & Plan Note (Signed)
Annual comprehensive preventive exam was done as well as an evaluation and management of chronic conditions .  During the course of the visit the patient was educated and counseled about appropriate screening and preventive services including :  diabetes screening, lipid analysis with projected  10 year  risk for CAD , nutrition counseling, breast, cervical and colorectal cancer screening, and recommended immunizations.  Printed recommendations for health maintenance screenings was given 

## 2018-02-02 NOTE — Assessment & Plan Note (Signed)
Managed with cymbalta and Elavil,  No changes today

## 2018-02-02 NOTE — Assessment & Plan Note (Signed)
Currently wll controlled with FodMaP diet .  Saw GI twice,  Did not return for additional workup

## 2018-02-02 NOTE — Patient Instructions (Signed)
Good to see you!    I have refilled your microgestin, duloxetine,  Amitriptyline  See you in 6 months   Health Maintenance, Female Adopting a healthy lifestyle and getting preventive care can go a long way to promote health and wellness. Talk with your health care provider about what schedule of regular examinations is right for you. This is a good chance for you to check in with your provider about disease prevention and staying healthy. In between checkups, there are plenty of things you can do on your own. Experts have done a lot of research about which lifestyle changes and preventive measures are most likely to keep you healthy. Ask your health care provider for more information. Weight and diet Eat a healthy diet  Be sure to include plenty of vegetables, fruits, low-fat dairy products, and lean protein.  Do not eat a lot of foods high in solid fats, added sugars, or salt.  Get regular exercise. This is one of the most important things you can do for your health. ? Most adults should exercise for at least 150 minutes each week. The exercise should increase your heart rate and make you sweat (moderate-intensity exercise). ? Most adults should also do strengthening exercises at least twice a week. This is in addition to the moderate-intensity exercise.  Maintain a healthy weight  Body mass index (BMI) is a measurement that can be used to identify possible weight problems. It estimates body fat based on height and weight. Your health care provider can help determine your BMI and help you achieve or maintain a healthy weight.  For females 29 years of age and older: ? A BMI below 18.5 is considered underweight. ? A BMI of 18.5 to 24.9 is normal. ? A BMI of 25 to 29.9 is considered overweight. ? A BMI of 30 and above is considered obese.  Watch levels of cholesterol and blood lipids  You should start having your blood tested for lipids and cholesterol at 29 years of age, then have  this test every 5 years.  You may need to have your cholesterol levels checked more often if: ? Your lipid or cholesterol levels are high. ? You are older than 29 years of age. ? You are at high risk for heart disease.  Cancer screening Lung Cancer  Lung cancer screening is recommended for adults 48-27 years old who are at high risk for lung cancer because of a history of smoking.  A yearly low-dose CT scan of the lungs is recommended for people who: ? Currently smoke. ? Have quit within the past 15 years. ? Have at least a 30-pack-year history of smoking. A pack year is smoking an average of one pack of cigarettes a day for 1 year.  Yearly screening should continue until it has been 15 years since you quit.  Yearly screening should stop if you develop a health problem that would prevent you from having lung cancer treatment.  Breast Cancer  Practice breast self-awareness. This means understanding how your breasts normally appear and feel.  It also means doing regular breast self-exams. Let your health care provider know about any changes, no matter how small.  If you are in your 20s or 30s, you should have a clinical breast exam (CBE) by a health care provider every 1-3 years as part of a regular health exam.  If you are 48 or older, have a CBE every year. Also consider having a breast X-ray (mammogram) every year.  If you  have a family history of breast cancer, talk to your health care provider about genetic screening.  If you are at high risk for breast cancer, talk to your health care provider about having an MRI and a mammogram every year.  Breast cancer gene (BRCA) assessment is recommended for women who have family members with BRCA-related cancers. BRCA-related cancers include: ? Breast. ? Ovarian. ? Tubal. ? Peritoneal cancers.  Results of the assessment will determine the need for genetic counseling and BRCA1 and BRCA2 testing.  Cervical Cancer Your health care  provider may recommend that you be screened regularly for cancer of the pelvic organs (ovaries, uterus, and vagina). This screening involves a pelvic examination, including checking for microscopic changes to the surface of your cervix (Pap test). You may be encouraged to have this screening done every 3 years, beginning at age 29.  For women ages 58-65, health care providers may recommend pelvic exams and Pap testing every 3 years, or they may recommend the Pap and pelvic exam, combined with testing for human papilloma virus (HPV), every 5 years. Some types of HPV increase your risk of cervical cancer. Testing for HPV may also be done on women of any age with unclear Pap test results.  Other health care providers may not recommend any screening for nonpregnant women who are considered low risk for pelvic cancer and who do not have symptoms. Ask your health care provider if a screening pelvic exam is right for you.  If you have had past treatment for cervical cancer or a condition that could lead to cancer, you need Pap tests and screening for cancer for at least 20 years after your treatment. If Pap tests have been discontinued, your risk factors (such as having a new sexual partner) need to be reassessed to determine if screening should resume. Some women have medical problems that increase the chance of getting cervical cancer. In these cases, your health care provider may recommend more frequent screening and Pap tests.  Colorectal Cancer  This type of cancer can be detected and often prevented.  Routine colorectal cancer screening usually begins at 29 years of age and continues through 29 years of age.  Your health care provider may recommend screening at an earlier age if you have risk factors for colon cancer.  Your health care provider may also recommend using home test kits to check for hidden blood in the stool.  A small camera at the end of a tube can be used to examine your colon  directly (sigmoidoscopy or colonoscopy). This is done to check for the earliest forms of colorectal cancer.  Routine screening usually begins at age 92.  Direct examination of the colon should be repeated every 5-10 years through 29 years of age. However, you may need to be screened more often if early forms of precancerous polyps or small growths are found.  Skin Cancer  Check your skin from head to toe regularly.  Tell your health care provider about any new moles or changes in moles, especially if there is a change in a mole's shape or color.  Also tell your health care provider if you have a mole that is larger than the size of a pencil eraser.  Always use sunscreen. Apply sunscreen liberally and repeatedly throughout the day.  Protect yourself by wearing long sleeves, pants, a wide-brimmed hat, and sunglasses whenever you are outside.  Heart disease, diabetes, and high blood pressure  High blood pressure causes heart disease and increases the  risk of stroke. High blood pressure is more likely to develop in: ? People who have blood pressure in the high end of the normal range (130-139/85-89 mm Hg). ? People who are overweight or obese. ? People who are African American.  If you are 35-9 years of age, have your blood pressure checked every 3-5 years. If you are 33 years of age or older, have your blood pressure checked every year. You should have your blood pressure measured twice-once when you are at a hospital or clinic, and once when you are not at a hospital or clinic. Record the average of the two measurements. To check your blood pressure when you are not at a hospital or clinic, you can use: ? An automated blood pressure machine at a pharmacy. ? A home blood pressure monitor.  If you are between 60 years and 104 years old, ask your health care provider if you should take aspirin to prevent strokes.  Have regular diabetes screenings. This involves taking a blood sample to  check your fasting blood sugar level. ? If you are at a normal weight and have a low risk for diabetes, have this test once every three years after 29 years of age. ? If you are overweight and have a high risk for diabetes, consider being tested at a younger age or more often. Preventing infection Hepatitis B  If you have a higher risk for hepatitis B, you should be screened for this virus. You are considered at high risk for hepatitis B if: ? You were born in a country where hepatitis B is common. Ask your health care provider which countries are considered high risk. ? Your parents were born in a high-risk country, and you have not been immunized against hepatitis B (hepatitis B vaccine). ? You have HIV or AIDS. ? You use needles to inject street drugs. ? You live with someone who has hepatitis B. ? You have had sex with someone who has hepatitis B. ? You get hemodialysis treatment. ? You take certain medicines for conditions, including cancer, organ transplantation, and autoimmune conditions.  Hepatitis C  Blood testing is recommended for: ? Everyone born from [birth year] through 1967. ? Anyone with known risk factors for hepatitis C.  Sexually transmitted infections (STIs)  You should be screened for sexually transmitted infections (STIs) including gonorrhea and chlamydia if: ? You are sexually active and are younger than 29 years of age. ? You are older than 29 years of age and your health care provider tells you that you are at risk for this type of infection. ? Your sexual activity has changed since you were last screened and you are at an increased risk for chlamydia or gonorrhea. Ask your health care provider if you are at risk.  If you do not have HIV, but are at risk, it may be recommended that you take a prescription medicine daily to prevent HIV infection. This is called pre-exposure prophylaxis (PrEP). You are considered at risk if: ? You are sexually active and do not regularly  use condoms or know the HIV status of your partner(s). ? You take drugs by injection. ? You are sexually active with a partner who has HIV.  Talk with your health care provider about whether you are at high risk of being infected with HIV. If you choose to begin PrEP, you should first be tested for HIV. You should then be tested every 3 months for as long as you are taking PrEP. Pregnancy  If you are premenopausal and you may become pregnant, ask your health care provider about preconception counseling.  If you may become pregnant, take 400 to 800 micrograms (mcg) of folic acid every day.  If you want to prevent pregnancy, talk to your health care provider about birth control (contraception). Osteoporosis and menopause  Osteoporosis is a disease in which the bones lose minerals and strength with aging. This can result in serious bone fractures. Your risk for osteoporosis can be identified using a bone density scan.  If you are 51 years of age or older, or if you are at risk for osteoporosis and fractures, ask your health care provider if you should be screened.  Ask your health care provider whether you should take a calcium or vitamin D supplement to lower your risk for osteoporosis.  Menopause may have certain physical symptoms and risks.  Hormone replacement therapy may reduce some of these symptoms and risks. Talk to your health care provider about whether hormone replacement therapy is right for you. Follow these instructions at home:  Schedule regular health, dental, and eye exams.  Stay current with your immunizations.  Do not use any tobacco products including cigarettes, chewing tobacco, or electronic cigarettes.  If you are pregnant, do not drink alcohol.  If you are breastfeeding, limit how much and how often you drink alcohol.  Limit alcohol intake to no more than 1 drink per day for nonpregnant women. One drink equals 12 ounces of beer, 5 ounces of wine, or 1 ounces  of hard liquor.  Do not use street drugs.  Do not share needles.  Ask your health care provider for help if you need support or information about quitting drugs.  Tell your health care provider if you often feel depressed.  Tell your health care provider if you have ever been abused or do not feel safe at home. This information is not intended to replace advice given to you by your health care provider. Make sure you discuss any questions you have with your health care provider. Document Released: 11/10/2010 Document Revised: 10/03/2015 Document Reviewed: 01/29/2015 Elsevier Interactive Patient Education  [employer].

## 2018-02-02 NOTE — Assessment & Plan Note (Signed)
Using oral contraceptives to manipulate periods  And reduce cramping.  Continue microgestin 1.5/30

## 2018-02-02 NOTE — Progress Notes (Signed)
Patient ID: Shelby Terry, female    DOB: Oct 13, 1990  Age: 27 y.o. MRN: [id number]  The patient is here for annual  preventive  examination and management of other chronic and acute problems.   The risk factors are reflected in the social history.  The roster of all physicians providing medical care to patient - is listed in the Snapshot section of the chart.  Activities of daily living:  The patient is 100% independent in all ADLs: dressing, toileting, feeding as well as independent mobility  Home safety : The patient has smoke detectors in the home. They wear seatbelts.  There are no firearms at home. There is no violence in the home.   There is no risks for hepatitis, STDs or HIV. There is no   history of blood transfusion. They have no travel history to infectious disease endemic areas of the world.  The patient has seen their dentist in the last six month. They have seen their eye doctor in the last year. They admit to slight hearing difficulty with regard to whispered voices and some television programs.  They have deferred audiologic testing in the last year.  They do not  have excessive sun exposure. Discussed the need for sun protection: hats, long sleeves and use of sunscreen if there is significant sun exposure.   Diet: the importance of a healthy diet is discussed. They do have a healthy diet.  The benefits of regular aerobic exercise were discussed. She walks 4 times per week ,  20 minutes.   Depression screen: there are no signs or vegative symptoms of depression- irritability, change in appetite, anhedonia, sadness/tearfullness.  Cognitive assessment: the patient manages all their financial and personal affairs and is actively engaged. They could relate day,date,year and events; recalled 2/3 objects at 3 minutes; performed clock-face test normally.  The following portions of the patient's history were reviewed and updated as appropriate: allergies, current medications, past  family history, past medical history,  past surgical history, past social history  and problem list.  Visual acuity was not assessed per patient preference since she has regular follow up with her ophthalmologist. Hearing and body mass index were assessed and reviewed.   During the course of the visit the patient was educated and counseled about appropriate screening and preventive services including : fall prevention , diabetes screening, nutrition counseling, colorectal cancer screening, and recommended immunizations.    CC: The primary encounter diagnosis was Cervical cancer screening. Diagnoses of Fatigue, unspecified type, Irritable bowel syndrome with diarrhea, Menstrual cycle disorder, Need for influenza vaccination, Visit for preventive health examination, Tachycardia, Fibromyalgia, and Chronic interstitial cystitis were also pertinent to this visit.  1) Referred to Dr Marius Ditch in April for diarrhea predominant IBS>  Abs ordered but patient did not return sice diarrhea resolved using  The  FodMap diet   2) several days of fevers, sinus congestion last week, cervical ymphadenopathy,  Now resolved subjective  .  Some exposure to mumps  Had the MMR and booster 20 yrs ago. Now reo  3) Painful menses:  Using oral contraceptives to manage /reduce menses to every 2 months.   History:  Chantrice has a past medical history of Allergy, Anxiety, Chicken pox (1994), Depression, Fibromyalgia, Interstitial cystitis, Irritable bowel syndrome (y-10), and Migraines (2008).   She has no past surgical history on file.   Her family history includes Asthma in her brother; Breast cancer in her paternal aunt and paternal grandmother; Cervical cancer in her maternal grandmother; Colon  polyps in her father and maternal grandfather; Diabetes in her father, maternal grandfather, maternal uncle, and mother; Emphysema in her paternal grandfather; Hyperlipidemia in her father, maternal grandfather, and mother; Hypertension  in her father and maternal grandfather; Liver disease in her maternal uncle; Lung cancer in her paternal grandfather; Prostate cancer in her maternal uncle.She reports that she has never smoked. She has never used smokeless tobacco. She reports that she drinks alcohol. She reports that she does not use drugs.  Outpatient Medications Prior to Visit  Medication Sig Dispense Refill  . acetaminophen (TYLENOL) 325 MG tablet Take 325 mg every 4 (four) hours as needed by mouth.    . triamcinolone cream (KENALOG) 0.1 % Apply 1 application topically 2 (two) times daily. 30 g 2  . amitriptyline (ELAVIL) 25 MG tablet Take 1 tablet (25 mg total) by mouth at bedtime. Corrected quantity 90 tablet 0  . DULoxetine 40 MG CPEP Take 40 mg by mouth daily. 90 capsule 1  . MICROGESTIN 1.5-30 MG-MCG tablet TAKE 1 TABLET BY MOUTH ONCE DAILY 84 tablet 0  . ferrous sulfate 325 (65 FE) MG tablet Take 325 mg by mouth daily with breakfast.    . vitamin B-12 (CYANOCOBALAMIN) 1000 MCG tablet Take 1,000 mcg by mouth daily.    . Vitamin D, Cholecalciferol, 400 units TABS Take 1 tablet by mouth daily.     No facility-administered medications prior to visit.     Review of Systems   Patient denies headache, fevers, malaise, unintentional weight loss, skin rash, eye pain, sinus congestion and sinus pain, sore throat, dysphagia,  hemoptysis , cough, dyspnea, wheezing, chest pain, palpitations, orthopnea, edema, abdominal pain, nausea, melena, diarrhea, constipation, flank pain, dysuria, hematuria, urinary  Frequency, nocturia, numbness, tingling, seizures,  Focal weakness, Loss of consciousness,  Tremor, insomnia, depression, anxiety, and suicidal ideation.      Objective:  BP 100/72 (BP Location: Left Arm, Patient Position: Sitting, Cuff Size: Normal)   Pulse (!) 108   Temp 98 F (36.7 C) (Oral)   Resp 14   Ht '5\' 5"'$  (1.651 m)   Wt 149 lb 6.4 oz (67.8 kg)   SpO2 99%   BMI 24.86 kg/m   Physical Exam  General  Appearance:    Alert, cooperative, no distress, appears stated age  Head:    Normocephalic, without obvious abnormality, atraumatic  Eyes:    PERRL, conjunctiva/corneas clear, EOM's intact, fundi    benign, both eyes  Ears:    Normal TM's and external ear canals, both ears  Nose:   Nares normal, septum midline, mucosa normal, no drainage    or sinus tenderness  Throat:   Lips, mucosa, and tongue normal; teeth and gums normal  Neck:   Supple, symmetrical, trachea midline, mild tender cervical  Adenopathy on the left.    thyroid:  no enlargement/tenderness/nodules; no carotid   bruit or JVD  Back:     Symmetric, no curvature, ROM normal, no CVA tenderness  Lungs:     Clear to auscultation bilaterally, respirations unlabored  Chest Wall:    No tenderness or deformity   Heart:    Regular rate and rhythm, S1 and S2 normal, no murmur, rub   or gallop  Breast Exam:    No tenderness, masses, or nipple abnormality  Abdomen:     Soft, non-tender, bowel sounds active all four quadrants,    no masses, no organomegaly  Genitalia:    Pelvic: cervix normal in appearance, external genitalia normal, no adnexal masses  or tenderness, no cervical motion tenderness, rectovaginal septum normal, uterus normal size, shape, and consistency and vagina normal with some caramel colored thick discharge  Extremities:   Extremities normal, atraumatic, no cyanosis or edema  Pulses:   2+ and symmetric all extremities  Skin:   Skin color, texture, turgor normal, no rashes or lesions  Lymph nodes:   Cervical, supraclavicular, and axillary nodes normal  Neurologic:   CNII-XII intact, normal strength, sensation and reflexes    throughout     Assessment & Plan:   Problem List Items Addressed This Visit    Chronic interstitial cystitis    Managed with Elavil.  No changes today       Fibromyalgia    Managed with cymbalta and Elavil,  No changes today       IBS (irritable bowel syndrome)    Currently wll controlled  with FodMaP diet .  Saw GI twice,  Did not return for additional workup      Menstrual cycle disorder     Using oral contraceptives to manipulate periods  And reduce cramping.  Continue microgestin 1.5/30       Tachycardia (Chronic)    Chronic with prior EKG normal .  No further workup as she is asymptomatic      Visit for preventive health examination    Annual comprehensive preventive exam was done as well as an evaluation and management of chronic conditions .  During the course of the visit the patient was educated and counseled about appropriate screening and preventive services including :  diabetes screening, lipid analysis with projected  10 year  risk for CAD , nutrition counseling, breast, cervical and colorectal cancer screening, and recommended immunizations.  Printed recommendations for health maintenance screenings was given       Other Visit Diagnoses    Cervical cancer screening    -  Primary   Relevant Orders   Cytology - PAP   Fatigue, unspecified type       Relevant Orders   Comprehensive metabolic panel   CBC with Differential/Platelet   TSH   Need for influenza vaccination       Relevant Orders   Flu Vaccine QUAD 6+ mos PF IM (Fluarix Quad PF) (Completed)      I have discontinued Estill Bamberg R. Hashimi's ferrous sulfate, vitamin B-12, and Vitamin D (Cholecalciferol). I have changed her MICROGESTIN to Norethindrone Acetate-Ethinyl Estradiol. I have also changed her DULoxetine HCl. Additionally, I am having her maintain her triamcinolone cream, acetaminophen, and amitriptyline.  Meds ordered this encounter  Medications  . Norethindrone Acetate-Ethinyl Estradiol (MICROGESTIN) 1.5-30 MG-MCG tablet    Sig: Take 1 tablet by mouth daily.    Dispense:  84 tablet    Refill:  3    KEEP ON FILE FOR FUTURE REFILLS  . amitriptyline (ELAVIL) 25 MG tablet    Sig: Take 1 tablet (25 mg total) by mouth at bedtime. Corrected quantity    Dispense:  90 tablet    Refill:  2     KEEP ON FILE FOR FUTURE REFILLS  . DULoxetine HCl 40 MG CPEP    Sig: Take 40 mg by mouth daily.    Dispense:  90 capsule    Refill:  1    Medications Discontinued During This Encounter  Medication Reason  . ferrous sulfate 325 (65 FE) MG tablet Patient Preference  . vitamin B-12 (CYANOCOBALAMIN) 1000 MCG tablet Patient Preference  . Vitamin D, Cholecalciferol, 400 units TABS Patient Preference  .  MICROGESTIN 1.5-30 MG-MCG tablet Reorder  . amitriptyline (ELAVIL) 25 MG tablet Reorder  . DULoxetine 40 MG CPEP Reorder    Follow-up: No follow-ups on file.   Crecencio Mc, MD

## 2018-02-02 NOTE — Assessment & Plan Note (Signed)
Managed with Elavil.  No changes today

## 2018-02-03 LAB — CBC WITH DIFFERENTIAL/PLATELET
Basophils Absolute: 0.1 10*3/uL (ref 0.0–0.1)
Basophils Relative: 1 % (ref 0.0–3.0)
EOS PCT: 4 % (ref 0.0–5.0)
Eosinophils Absolute: 0.3 10*3/uL (ref 0.0–0.7)
HCT: 39 % (ref 36.0–46.0)
Hemoglobin: 12.9 g/dL (ref 12.0–15.0)
LYMPHS ABS: 2.5 10*3/uL (ref 0.7–4.0)
Lymphocytes Relative: 35.3 % (ref 12.0–46.0)
MCHC: 33 g/dL (ref 30.0–36.0)
MCV: 84.9 fl (ref 78.0–100.0)
MONO ABS: 0.6 10*3/uL (ref 0.1–1.0)
MONOS PCT: 8.6 % (ref 3.0–12.0)
NEUTROS ABS: 3.6 10*3/uL (ref 1.4–7.7)
NEUTROS PCT: 51.1 % (ref 43.0–77.0)
PLATELETS: 393 10*3/uL (ref 150.0–400.0)
RBC: 4.6 Mil/uL (ref 3.87–5.11)
RDW: 13 % (ref 11.5–15.5)
WBC: 7.1 10*3/uL (ref 4.0–10.5)

## 2018-02-03 LAB — COMPREHENSIVE METABOLIC PANEL
ALT: 14 U/L (ref 0–35)
AST: 17 U/L (ref 0–37)
Albumin: 3.9 g/dL (ref 3.5–5.2)
Alkaline Phosphatase: 63 U/L (ref 39–117)
BUN: 10 mg/dL (ref 6–23)
CO2: 24 meq/L (ref 19–32)
Calcium: 9 mg/dL (ref 8.4–10.5)
Chloride: 103 mEq/L (ref 96–112)
Creatinine, Ser: 0.7 mg/dL (ref 0.40–1.20)
GFR: 105.1 mL/min (ref >60.00)
GLUCOSE: 91 mg/dL (ref 70–99)
POTASSIUM: 4.8 meq/L (ref 3.5–5.1)
SODIUM: 135 meq/L (ref 135–145)
Total Bilirubin: 0.6 mg/dL (ref 0.2–1.2)
Total Protein: 7.2 g/dL (ref 6.0–8.3)

## 2018-02-03 LAB — TSH: TSH: 2.07 u[IU]/mL (ref 0.35–4.50)

## 2018-02-04 LAB — CYTOLOGY - PAP
Bacterial vaginitis: NEGATIVE
Diagnosis: NEGATIVE
HPV: NOT DETECTED

## 2018-02-09 ENCOUNTER — Ambulatory Visit: Payer: BLUE CROSS/BLUE SHIELD | Admitting: Internal Medicine

## 2018-06-24 DIAGNOSIS — F410 Panic disorder [episodic paroxysmal anxiety] without agoraphobia: Secondary | ICD-10-CM | POA: Diagnosis not present

## 2018-06-24 DIAGNOSIS — F411 Generalized anxiety disorder: Secondary | ICD-10-CM | POA: Diagnosis not present

## 2018-06-28 DIAGNOSIS — F410 Panic disorder [episodic paroxysmal anxiety] without agoraphobia: Secondary | ICD-10-CM | POA: Diagnosis not present

## 2018-06-28 DIAGNOSIS — F411 Generalized anxiety disorder: Secondary | ICD-10-CM | POA: Diagnosis not present

## 2018-07-04 DIAGNOSIS — F410 Panic disorder [episodic paroxysmal anxiety] without agoraphobia: Secondary | ICD-10-CM | POA: Diagnosis not present

## 2018-07-04 DIAGNOSIS — F411 Generalized anxiety disorder: Secondary | ICD-10-CM | POA: Diagnosis not present

## 2018-07-11 DIAGNOSIS — F411 Generalized anxiety disorder: Secondary | ICD-10-CM | POA: Diagnosis not present

## 2018-07-11 DIAGNOSIS — F410 Panic disorder [episodic paroxysmal anxiety] without agoraphobia: Secondary | ICD-10-CM | POA: Diagnosis not present

## 2018-07-18 DIAGNOSIS — F410 Panic disorder [episodic paroxysmal anxiety] without agoraphobia: Secondary | ICD-10-CM | POA: Diagnosis not present

## 2018-07-18 DIAGNOSIS — F411 Generalized anxiety disorder: Secondary | ICD-10-CM | POA: Diagnosis not present

## 2018-08-01 MED ORDER — DULOXETINE HCL 40 MG PO CPEP: 40.0000 mg | ORAL_CAPSULE | Freq: Every day | ORAL | 1 refills | Status: DC

## 2018-08-15 DIAGNOSIS — F410 Panic disorder [episodic paroxysmal anxiety] without agoraphobia: Secondary | ICD-10-CM | POA: Diagnosis not present

## 2018-08-15 DIAGNOSIS — F411 Generalized anxiety disorder: Secondary | ICD-10-CM | POA: Diagnosis not present

## 2018-08-22 DIAGNOSIS — F410 Panic disorder [episodic paroxysmal anxiety] without agoraphobia: Secondary | ICD-10-CM | POA: Diagnosis not present

## 2018-08-22 DIAGNOSIS — F411 Generalized anxiety disorder: Secondary | ICD-10-CM | POA: Diagnosis not present

## 2018-08-29 DIAGNOSIS — F411 Generalized anxiety disorder: Secondary | ICD-10-CM | POA: Diagnosis not present

## 2018-08-29 DIAGNOSIS — F410 Panic disorder [episodic paroxysmal anxiety] without agoraphobia: Secondary | ICD-10-CM | POA: Diagnosis not present

## 2018-09-12 DIAGNOSIS — F410 Panic disorder [episodic paroxysmal anxiety] without agoraphobia: Secondary | ICD-10-CM | POA: Diagnosis not present

## 2018-09-12 DIAGNOSIS — F411 Generalized anxiety disorder: Secondary | ICD-10-CM | POA: Diagnosis not present

## 2018-09-26 DIAGNOSIS — F410 Panic disorder [episodic paroxysmal anxiety] without agoraphobia: Secondary | ICD-10-CM | POA: Diagnosis not present

## 2018-09-26 DIAGNOSIS — F411 Generalized anxiety disorder: Secondary | ICD-10-CM | POA: Diagnosis not present

## 2018-10-31 DIAGNOSIS — F411 Generalized anxiety disorder: Secondary | ICD-10-CM | POA: Diagnosis not present

## 2018-10-31 DIAGNOSIS — F410 Panic disorder [episodic paroxysmal anxiety] without agoraphobia: Secondary | ICD-10-CM | POA: Diagnosis not present

## 2018-11-14 DIAGNOSIS — F410 Panic disorder [episodic paroxysmal anxiety] without agoraphobia: Secondary | ICD-10-CM | POA: Diagnosis not present

## 2018-11-14 DIAGNOSIS — F411 Generalized anxiety disorder: Secondary | ICD-10-CM | POA: Diagnosis not present

## 2018-11-28 ENCOUNTER — Other Ambulatory Visit: Payer: Self-pay | Admitting: Internal Medicine

## 2018-11-28 DIAGNOSIS — F410 Panic disorder [episodic paroxysmal anxiety] without agoraphobia: Secondary | ICD-10-CM | POA: Diagnosis not present

## 2018-11-28 DIAGNOSIS — F411 Generalized anxiety disorder: Secondary | ICD-10-CM | POA: Diagnosis not present

## 2018-12-26 DIAGNOSIS — F410 Panic disorder [episodic paroxysmal anxiety] without agoraphobia: Secondary | ICD-10-CM | POA: Diagnosis not present

## 2018-12-26 DIAGNOSIS — F411 Generalized anxiety disorder: Secondary | ICD-10-CM | POA: Diagnosis not present

## 2019-01-30 DIAGNOSIS — F411 Generalized anxiety disorder: Secondary | ICD-10-CM | POA: Diagnosis not present

## 2019-01-30 DIAGNOSIS — F410 Panic disorder [episodic paroxysmal anxiety] without agoraphobia: Secondary | ICD-10-CM | POA: Diagnosis not present

## 2019-02-09 ENCOUNTER — Other Ambulatory Visit: Payer: Self-pay | Admitting: Internal Medicine

## 2019-02-13 DIAGNOSIS — F411 Generalized anxiety disorder: Secondary | ICD-10-CM | POA: Diagnosis not present

## 2019-02-13 DIAGNOSIS — F410 Panic disorder [episodic paroxysmal anxiety] without agoraphobia: Secondary | ICD-10-CM | POA: Diagnosis not present

## 2019-02-21 MED ORDER — NORETHINDRONE ACET-ETHINYL EST 1.5-30 MG-MCG PO TABS: 1.0000 | ORAL_TABLET | Freq: Every day | ORAL | 0 refills | Status: DC

## 2019-03-03 ENCOUNTER — Telehealth: Payer: Self-pay | Admitting: Internal Medicine

## 2019-03-03 NOTE — Telephone Encounter (Signed)
Lm to call office to set up a virtual appt for medication refill/lab results. Also make a appointment for a flu shot with nurse. Physical will have to be scheduled for afternoon appt sometime in late December into January. FYI; last physical of the day is at 3:30. Thank you.

## 2019-03-13 ENCOUNTER — Other Ambulatory Visit: Payer: Self-pay

## 2019-03-13 DIAGNOSIS — F410 Panic disorder [episodic paroxysmal anxiety] without agoraphobia: Secondary | ICD-10-CM | POA: Diagnosis not present

## 2019-03-13 DIAGNOSIS — F411 Generalized anxiety disorder: Secondary | ICD-10-CM | POA: Diagnosis not present

## 2019-03-13 MED ORDER — AMITRIPTYLINE HCL 25 MG PO TABS: 25.0000 mg | ORAL_TABLET | Freq: Every day | ORAL | 0 refills | Status: DC

## 2019-03-13 MED ORDER — NORETHINDRONE ACET-ETHINYL EST 1.5-30 MG-MCG PO TABS: 1.0000 | ORAL_TABLET | Freq: Every day | ORAL | 0 refills | Status: DC

## 2019-03-13 NOTE — Telephone Encounter (Signed)
Ok to refill,  Refill sent for 30 days each

## 2019-03-13 NOTE — Telephone Encounter (Signed)
Pt has made appt for med follow up, first available which is Friday, 11/06. Only has one tab of the   amitriptyline (ELAVIL) 25 MG tablet  Norethindrone Acetate-Ethinyl Estradiol (MICROGESTIN) 1.5-30 MG-MCG tablet (not as important as the elavil)  Can you send in 5 days of  Amitriptyline?  [place], [place] - [place] [phone] (Phone) [phone] (Fax)   Pt has scheduled cpe May 02, 2019

## 2019-03-13 NOTE — Telephone Encounter (Signed)
LMTCB. Pt has not been seen in a year. Needs to schedule an appointment with Dr. Derrel Nip.

## 2019-03-17 ENCOUNTER — Encounter: Payer: Self-pay | Admitting: Internal Medicine

## 2019-03-17 ENCOUNTER — Ambulatory Visit (INDEPENDENT_AMBULATORY_CARE_PROVIDER_SITE_OTHER): Payer: BC Managed Care – PPO | Admitting: Internal Medicine

## 2019-03-17 ENCOUNTER — Other Ambulatory Visit: Payer: Self-pay

## 2019-03-17 DIAGNOSIS — Z7189 Other specified counseling: Secondary | ICD-10-CM | POA: Insufficient documentation

## 2019-03-17 DIAGNOSIS — M797 Fibromyalgia: Secondary | ICD-10-CM

## 2019-03-17 DIAGNOSIS — J019 Acute sinusitis, unspecified: Secondary | ICD-10-CM | POA: Insufficient documentation

## 2019-03-17 DIAGNOSIS — J0110 Acute frontal sinusitis, unspecified: Secondary | ICD-10-CM | POA: Diagnosis not present

## 2019-03-17 MED ORDER — PREDNISONE 10 MG PO TABS: ORAL_TABLET | ORAL | 0 refills | Status: DC

## 2019-03-17 MED ORDER — LEVOFLOXACIN 500 MG PO TABS: 500.0000 mg | ORAL_TABLET | Freq: Every day | ORAL | 0 refills | Status: DC

## 2019-03-17 MED ORDER — DULOXETINE HCL 40 MG PO CPEP: 1.0000 | ORAL_CAPSULE | Freq: Every day | ORAL | 0 refills | Status: DC

## 2019-03-17 NOTE — Patient Instructions (Signed)
You can increase the elavil to 50 mg at bedtime if needed for insomnia/pain.  If this is too sedating,  We can increase the cymbalta to 60 mg    I am treating you for sinusitis/bronchitis  .   I am prescribing an antibiotic (Levaquin : take once daily with food ) and a prednisone taper  To manage the infection and the inflammation in your sinuses.   The prednisone taper should be taken as follows :    6 tablets all at once  IN THE MORNING  on Day 1,  Then 5 tablets on day 2  CONTINUE TO TAPER  by 1 tablet daily until gone  Use OTC Delsym for the cough .    I also advise use of the following OTC meds to help with your other symptoms.   Take generic sudafed PE  10 mg every 8 hours for the congestion,  Or continue your current "cold and sinus " multi drug remedy.  you may substitute Afrin nasal spray for the nighttime dose of sudafed PE  If needed to prevent insomnia.  flush your sinuses twice daily with  A NETTIE POT, or Neil Med's sinus rinse   (do this over the sink and keep lots of tissues handy  because if you do it right you will blow or spit out globs of mucus)  Gargle with salt water as needed for sore throat.   Please take a probiotic ( Align, Floraque or Culturelle), the generic version of one of these over the counter medications, or an alternative form (kombucha,  Yogurt, or another dietary source) for a minimum of 3 weeks to prevent a serious antibiotic associated diarrhea  Called clostridium dificile colitis.   You need to use a backup birth control method for the rest of this cycle because of the antibiotic's diminishing effect on your oral contraceptive

## 2019-03-17 NOTE — Assessment & Plan Note (Addendum)
Educated patient on the newly broadened list of signs and symptoms of COVID-19 infection and ways to avoid the viral infection including washing hands frequently with soap and water,  using hand sanitizer if unable to wash, avoiding touching face,  staying at home and limiting visitors,  and avoiding contact with people coming in and out of home.  The importance of continued social distancing was discussed today . Patient was screened for the development of any unsafe behaviors or habits that may have developed as a result of the social impact of the virus , including alcohol abuse,  Domestic violence, tobacco abuse and overeating.    

## 2019-03-17 NOTE — Progress Notes (Signed)
Virtual Visit via [email or web address]  This visit type was conducted due to national recommendations for restrictions regarding the COVID-19 pandemic (e.g. social distancing).  This format is felt to be most appropriate for this patient at this time.  All issues noted in this document were discussed and addressed.  No physical exam was performed (except for noted visual exam findings with Video Visits).   I connected with@ on 03/17/19 at  8:00 AM EST by a video enabled telemedicine application  and verified that I am speaking with the correct person using two identifiers. Location patient: home Location provider: work or home office Persons participating in the virtual visit: patient, provider  I discussed the limitations, risks, security and privacy concerns of performing an evaluation and management service by telephone and the availability of in person appointments. I also discussed with the patient that there may be a patient responsible charge related to this service. The patient expressed understanding and agreed to proceed.  Reason for visit: anxiety,  Fibromyalgia, insomnia,  And sinusitis HPI:  Increased pain causing insomnia.  Using elavil 25 mg and cymbalta 40 mg daily..  back to work at [employer] ,  [employer]. Anxiety re COVID .  Not exercising regularly due to knee pain   5 days of sinus congestion ,,  Rhinitis  And now chest congestion.  Tmax 99  COVID TESTED ON Monday AND NEGATIVE.   ROS: See pertinent positives and negatives per HPI.  Past Medical History:  Diagnosis Date  . Allergy   . Anxiety   . Chicken pox 1994  . Depression   . Fibromyalgia   . Interstitial cystitis   . Irritable bowel syndrome y-8  . Migraines 2008    No past surgical history on file.  Family History  Problem Relation Age of Onset  . Hyperlipidemia Mother   . Diabetes Mother   . Hyperlipidemia Father   . Hypertension Father   . Diabetes Father   . Colon polyps Father   . Cervical cancer  Maternal Grandmother   . Hypertension Maternal Grandfather   . Hyperlipidemia Maternal Grandfather   . Colon polyps Maternal Grandfather   . Diabetes Maternal Grandfather   . Breast cancer Paternal Grandmother   . Lung cancer Paternal Grandfather   . Emphysema Paternal Grandfather   . Prostate cancer Maternal Uncle   . Breast cancer Paternal Aunt   . Diabetes Maternal Uncle   . Liver disease Maternal Uncle        x 2  . Asthma Brother     SOCIAL HX:  reports that she has never smoked. She has never used smokeless tobacco. She reports current alcohol use. She reports that she does not use drugs.   Current Outpatient Medications:  .  acetaminophen (TYLENOL) 325 MG tablet, Take 325 mg every 4 (four) hours as needed by mouth., Disp: , Rfl:  .  amitriptyline (ELAVIL) 25 MG tablet, Take 1 tablet (25 mg total) by mouth at bedtime., Disp: 30 tablet, Rfl: 0 .  DULoxetine HCl 40 MG CPEP, Take 1 capsule by mouth daily., Disp: 30 capsule, Rfl: 0 .  Norethindrone Acetate-Ethinyl Estradiol (MICROGESTIN) 1.5-30 MG-MCG tablet, Take 1 tablet by mouth daily., Disp: 28 tablet, Rfl: 0 .  triamcinolone cream (KENALOG) 0.1 %, Apply 1 application topically 2 (two) times daily., Disp: 30 g, Rfl: 2 .  levofloxacin (LEVAQUIN) 500 MG tablet, Take 1 tablet (500 mg total) by mouth daily., Disp: 7 tablet, Rfl: 0 .  predniSONE (DELTASONE) 10  MG tablet, 6 tablets on Day 1 , then reduce by 1 tablet daily until gone, Disp: 21 tablet, Rfl: 0  EXAM:  VITALS per patient if applicable:  GENERAL: alert, oriented, appears well and in no acute distress  HEENT: atraumatic, conjunttiva clear, no obvious abnormalities on inspection of external nose and ears  NECK: normal movements of the head and neck  LUNGS: on inspection no signs of respiratory distress, breathing rate appears normal, no obvious gross SOB, gasping or wheezing  CV: no obvious cyanosis  MS: moves all visible extremities without noticeable  abnormality  PSYCH/NEURO: pleasant and cooperative, no obvious depression or anxiety, speech and thought processing grossly intact  ASSESSMENT AND PLAN:  Discussed the following assessment and plan:  Fibromyalgia  Acute frontal sinusitis, recurrence not specified  Advice given about COVID-19 virus infection  Fibromyalgia Not under control.  Advised to increase elvail to 50 mg,  If not effective or tolerated will increase cymbalta to 60 mg.  Encouraged to exercise daily   Sinusitis, acute COVID TEST WAS NEGATIVE ON Monday  Given chronicity of symptoms, development of facial pain and exam consistent with bacterial URI,  Will treat with empiric antibiotics, decongestants, prednisone taper, cough suppressant and saline lavage.   Probiotic advised   Advice given about COVID-19 virus infection Educated patient on the newly broadened list of signs and symptoms of COVID-19 infection and ways to avoid the viral infection including washing hands frequently with soap and water,  using hand sanitizer if unable to wash, avoiding touching face,  staying at home and limiting visitors,  and avoiding contact with people coming in and out of home.  The importance of continued social distancing was discussed today . Patient was screened for the development of any unsafe behaviors or habits that may have developed as a result of the social impact of the virus , including alcohol abuse,  Domestic violence, tobacco abuse and overeating.       I discussed the assessment and treatment plan with the patient. The patient was provided an opportunity to ask questions and all were answered. The patient agreed with the plan and demonstrated an understanding of the instructions.   The patient was advised to call back or seek an in-person evaluation if the symptoms worsen or if the condition fails to improve as anticipated.  I provided  25 minutes of non-face-to-face time during this encounter reviewing patient's  current problems and post surgeries.  Providing counseling on the above mentioned problems , and coordination  of care .  Shelby Mc, MD

## 2019-03-17 NOTE — Assessment & Plan Note (Signed)
COVID TEST WAS NEGATIVE ON Monday  Given chronicity of symptoms, development of facial pain and exam consistent with bacterial URI,  Will treat with empiric antibiotics, decongestants, prednisone taper, cough suppressant and saline lavage.   Probiotic advised

## 2019-03-17 NOTE — Assessment & Plan Note (Signed)
Not under control.  Advised to increase elvail to 50 mg,  If not effective or tolerated will increase cymbalta to 60 mg.  Encouraged to exercise daily

## 2019-04-10 ENCOUNTER — Other Ambulatory Visit: Payer: Self-pay | Admitting: Internal Medicine

## 2019-04-10 DIAGNOSIS — F410 Panic disorder [episodic paroxysmal anxiety] without agoraphobia: Secondary | ICD-10-CM | POA: Diagnosis not present

## 2019-04-10 DIAGNOSIS — F411 Generalized anxiety disorder: Secondary | ICD-10-CM | POA: Diagnosis not present

## 2019-04-11 ENCOUNTER — Other Ambulatory Visit: Payer: Self-pay

## 2019-04-11 DIAGNOSIS — Z20822 Contact with and (suspected) exposure to covid-19: Secondary | ICD-10-CM

## 2019-04-13 LAB — NOVEL CORONAVIRUS, NAA: SARS-CoV-2, NAA: NOT DETECTED

## 2019-04-19 DIAGNOSIS — Z0000 Encounter for general adult medical examination without abnormal findings: Secondary | ICD-10-CM

## 2019-04-19 NOTE — Telephone Encounter (Signed)
Pt would like to have labs done at [facility] before her appt with you on 05/02/2019. I have ordered A1c, CMP, TSH, CBC and Lipid panel. Is there anything else that needs to be ordered?

## 2019-05-01 DIAGNOSIS — F410 Panic disorder [episodic paroxysmal anxiety] without agoraphobia: Secondary | ICD-10-CM | POA: Diagnosis not present

## 2019-05-01 DIAGNOSIS — F411 Generalized anxiety disorder: Secondary | ICD-10-CM | POA: Diagnosis not present

## 2019-05-01 DIAGNOSIS — Z0000 Encounter for general adult medical examination without abnormal findings: Secondary | ICD-10-CM | POA: Diagnosis not present

## 2019-05-01 DIAGNOSIS — Z20828 Contact with and (suspected) exposure to other viral communicable diseases: Secondary | ICD-10-CM | POA: Diagnosis not present

## 2019-05-02 ENCOUNTER — Ambulatory Visit (INDEPENDENT_AMBULATORY_CARE_PROVIDER_SITE_OTHER): Payer: BC Managed Care – PPO | Admitting: Internal Medicine

## 2019-05-02 ENCOUNTER — Telehealth: Payer: Self-pay

## 2019-05-02 ENCOUNTER — Encounter: Payer: Self-pay | Admitting: Internal Medicine

## 2019-05-02 ENCOUNTER — Other Ambulatory Visit: Payer: Self-pay

## 2019-05-02 VITALS — Ht 65.0 in | Wt 160.0 lb

## 2019-05-02 DIAGNOSIS — M797 Fibromyalgia: Secondary | ICD-10-CM

## 2019-05-02 DIAGNOSIS — Z20822 Contact with and (suspected) exposure to covid-19: Secondary | ICD-10-CM

## 2019-05-02 DIAGNOSIS — Z0000 Encounter for general adult medical examination without abnormal findings: Secondary | ICD-10-CM

## 2019-05-02 DIAGNOSIS — Z20828 Contact with and (suspected) exposure to other viral communicable diseases: Secondary | ICD-10-CM | POA: Diagnosis not present

## 2019-05-02 DIAGNOSIS — G43709 Chronic migraine without aura, not intractable, without status migrainosus: Secondary | ICD-10-CM

## 2019-05-02 LAB — COMPREHENSIVE METABOLIC PANEL
ALT: 36 IU/L — ABNORMAL HIGH (ref 0–32)
AST: 31 IU/L (ref 0–40)
Albumin/Globulin Ratio: 1.7 (ref 1.2–2.2)
Albumin: 4.3 g/dL (ref 3.9–5.0)
Alkaline Phosphatase: 123 IU/L — ABNORMAL HIGH (ref 39–117)
BUN/Creatinine Ratio: 10 (ref 9–23)
BUN: 9 mg/dL (ref 6–20)
Bilirubin Total: 0.5 mg/dL (ref 0.0–1.2)
CO2: 19 mmol/L — ABNORMAL LOW (ref 20–29)
Calcium: 9.8 mg/dL (ref 8.7–10.2)
Chloride: 99 mmol/L (ref 96–106)
Creatinine, Ser: 0.93 mg/dL (ref 0.57–1.00)
GFR calc Af Amer: 95 mL/min/{1.73_m2} (ref >59)
GFR calc non Af Amer: 83 mL/min/{1.73_m2} (ref >59)
Globulin, Total: 2.6 g/dL (ref 1.5–4.5)
Glucose: 80 mg/dL (ref 65–99)
Potassium: 5.1 mmol/L (ref 3.5–5.2)
Sodium: 137 mmol/L (ref 134–144)
Total Protein: 6.9 g/dL (ref 6.0–8.5)

## 2019-05-02 LAB — HEMOGLOBIN A1C
Est. average glucose Bld gHb Est-mCnc: 97 mg/dL
Hgb A1c MFr Bld: 5 % (ref 4.8–5.6)

## 2019-05-02 LAB — LIPID PANEL
Chol/HDL Ratio: 4.6 ratio — ABNORMAL HIGH (ref 0.0–4.4)
Cholesterol, Total: 339 mg/dL — ABNORMAL HIGH (ref 100–199)
HDL: 74 mg/dL (ref >39)
LDL Chol Calc (NIH): 225 mg/dL — ABNORMAL HIGH (ref 0–99)
Triglycerides: 208 mg/dL — ABNORMAL HIGH (ref 0–149)
VLDL Cholesterol Cal: 40 mg/dL (ref 5–40)

## 2019-05-02 LAB — CBC WITH DIFFERENTIAL/PLATELET
Basophils Absolute: 0 10*3/uL (ref 0.0–0.2)
Basos: 1 %
EOS (ABSOLUTE): 0.1 10*3/uL (ref 0.0–0.4)
Eos: 2 %
Hematocrit: 41.2 % (ref 34.0–46.6)
Hemoglobin: 13.6 g/dL (ref 11.1–15.9)
Immature Grans (Abs): 0 10*3/uL (ref 0.0–0.1)
Immature Granulocytes: 0 %
Lymphocytes Absolute: 2.7 10*3/uL (ref 0.7–3.1)
Lymphs: 44 %
MCH: 27.5 pg (ref 26.6–33.0)
MCHC: 33 g/dL (ref 31.5–35.7)
MCV: 83 fL (ref 79–97)
Monocytes Absolute: 0.5 10*3/uL (ref 0.1–0.9)
Monocytes: 7 %
Neutrophils Absolute: 2.9 10*3/uL (ref 1.4–7.0)
Neutrophils: 46 %
Platelets: 363 10*3/uL (ref 150–450)
RBC: 4.95 x10E6/uL (ref 3.77–5.28)
RDW: 12.3 % (ref 11.7–15.4)
WBC: 6.3 10*3/uL (ref 3.4–10.8)

## 2019-05-02 LAB — TSH: TSH: 3.52 u[IU]/mL (ref 0.450–4.500)

## 2019-05-02 NOTE — Telephone Encounter (Signed)
Error

## 2019-05-02 NOTE — Progress Notes (Signed)
Virtual Visit converted to telephone Note  This visit type was conducted due to national recommendations for restrictions regarding the COVID-19 pandemic (e.g. social distancing).  This format is felt to be most appropriate for this patient at this time.  All issues noted in this document were discussed and addressed.  No physical exam was performed (except for noted visual exam findings with Video Visits).   I connected with@ on 05/02/19 at  3:30 PM EST by a video enabled telemedicine application.  Interactive audio and video telecommunications were initially established beteen this provider and patient, however ultimately failed, due to patient having technical difficulties. We continued and completed visit with audio only  and verified that I am speaking with the correct person using two identifiersLocation patient: home Location provider: work or home office Persons participating in the virtual visit: patient, provider  I discussed the limitations, risks, security and privacy concerns of performing an evaluation and management service by telephone and the availability of in person appointments. I also discussed with the patient that there may be a patient responsible charge related to this service. The patient expressed understanding and agreed to proceed.  Reason for visit: CPE  HPI:  Father sick with  COVID 19   Repeat testing requested for ab by patient  Elevated lfts.  Does not drink.  Has been eating more carbs.  Needs repeat labs in 3 weeks to be done at [facility]   IBS quiescent  FM improving  with less stress,  More exercise.     ROS: See pertinent positives and negatives per HPI.  Past Medical History:  Diagnosis Date  . Allergy   . Anxiety   . Chicken pox 1994  . Depression   . Fibromyalgia   . Interstitial cystitis   . Irritable bowel syndrome y-8  . Migraines 2008    No past surgical history on file.  Family History  Problem Relation Age of Onset  .  Hyperlipidemia Mother   . Diabetes Mother   . Hyperlipidemia Father   . Hypertension Father   . Diabetes Father   . Colon polyps Father   . Cervical cancer Maternal Grandmother   . Hypertension Maternal Grandfather   . Hyperlipidemia Maternal Grandfather   . Colon polyps Maternal Grandfather   . Diabetes Maternal Grandfather   . Breast cancer Paternal Grandmother   . Lung cancer Paternal Grandfather   . Emphysema Paternal Grandfather   . Prostate cancer Maternal Uncle   . Breast cancer Paternal Aunt   . Diabetes Maternal Uncle   . Liver disease Maternal Uncle        x 2  . Asthma Brother     SOCIAL HX:  reports that she has never smoked. She has never used smokeless tobacco. She reports current alcohol use. She reports that she does not use drugs.   Current Outpatient Medications:  .  acetaminophen (TYLENOL) 325 MG tablet, Take 325 mg every 4 (four) hours as needed by mouth., Disp: , Rfl:  .  amitriptyline (ELAVIL) 25 MG tablet, TAKE 1 TABLET BY MOUTH AT BEDTIME, Disp: 30 tablet, Rfl: 0 .  DULoxetine HCl 40 MG CPEP, TAKE 1 CAPSULE BY MOUTH ONCE DAILY (NEEDS  APPOINTMENT  FOR  MORE  REFILLS), Disp: 30 capsule, Rfl: 0 .  Norethindrone Acetate-Ethinyl Estradiol (MICROGESTIN) 1.5-30 MG-MCG tablet, Take 1 tablet by mouth daily., Disp: 28 tablet, Rfl: 0 .  triamcinolone cream (KENALOG) 0.1 %, Apply 1 application topically 2 (two) times daily., Disp: 30 g,  Rfl: 2  EXAM:  VITALS per patient if applicable:  GENERAL: alert, oriented, appears well and in no acute distress  HEENT: atraumatic, conjunttiva clear, no obvious abnormalities on inspection of external nose and ears  NECK: normal movements of the head and neck  LUNGS: on inspection no signs of respiratory distress, breathing rate appears normal, no obvious gross SOB, gasping or wheezing  CV: no obvious cyanosis  MS: moves all visible extremities without noticeable abnormality  PSYCH/NEURO: pleasant and cooperative, no  obvious depression or anxiety, speech and thought processing grossly intact  ASSESSMENT AND PLAN:  Discussed the following assessment and plan:  Close exposure to COVID-19 virus - Plan: SAR CoV2 Serology (COVID 19)AB(IGG)IA, SARS-CoV-2 Antibody, IgM, SARS-CoV-2 Antibody, IgA  Fibromyalgia  Visit for preventive health examination  Chronic migraine without aura without status migrainosus, not intractable  Fibromyalgia Improving  symptoms with exercise and reduction in stress   Visit for preventive health examination age appropriate education and counseling updated, referrals for preventative services and immunizations addressed, dietary and smoking counseling addressed, most recent labs reviewed.  I have personally reviewed and have noted:  1) the patient's medical and social history 2) The pt's use of alcohol, tobacco, and illicit drugs 3) The patient's current medications and supplements 4) Functional ability including ADL's, fall risk, home safety risk, hearing and visual impairment 5) Diet and physical activities 6) Evidence for depression or mood disorder 7) The patient's height, weight, and BMI have been recorded in the chart  I have made referrals, and provided counseling and education based on review of the above  Migraine, chronic, without aura Improved frequency    I discussed the assessment and treatment plan with the patient. The patient was provided an opportunity to ask questions and all were answered. The patient agreed with the plan and demonstrated an understanding of the instructions.   I provided  25 minutes of non-face-to-face time during this encounter reviewing patient's current problems and past procedures/imaging studies, providing counseling on the above mentioned problems , and coordination  of care .  The patient was advised to call back or seek an in-person evaluation if the symptoms worsen or if the condition fails to improve as  anticipated.   Crecencio Mc, MD

## 2019-05-02 NOTE — Telephone Encounter (Signed)
Which test is she asking fr?  I do not see a [facility] antibody test in epic

## 2019-05-03 LAB — SPECIMEN STATUS REPORT

## 2019-05-03 NOTE — Assessment & Plan Note (Signed)
Improved frequency

## 2019-05-03 NOTE — Assessment & Plan Note (Signed)

## 2019-05-03 NOTE — Assessment & Plan Note (Signed)
Improving  symptoms with exercise and reduction in stress

## 2019-05-09 LAB — SAR COV2 SEROLOGY (COVID19)AB(IGG),IA: DiaSorin SARS-CoV-2 Ab, IgG: NEGATIVE

## 2019-05-09 LAB — SPECIMEN STATUS REPORT

## 2019-05-09 LAB — SARS-COV-2 ANTIBODY, IGM: SARS-CoV-2 Antibody, IgM: NEGATIVE

## 2019-05-09 LAB — SARS-COV-2 ANTIBODY, IGA: SARS-CoV-2 Antibody, IgA: NEGATIVE

## 2019-05-10 ENCOUNTER — Other Ambulatory Visit: Payer: Self-pay | Admitting: Internal Medicine

## 2019-05-11 ENCOUNTER — Other Ambulatory Visit: Payer: Self-pay | Admitting: Internal Medicine

## 2019-06-05 ENCOUNTER — Other Ambulatory Visit: Payer: Self-pay | Admitting: Internal Medicine

## 2019-07-02 ENCOUNTER — Other Ambulatory Visit: Payer: Self-pay | Admitting: Internal Medicine

## 2019-07-12 DIAGNOSIS — F410 Panic disorder [episodic paroxysmal anxiety] without agoraphobia: Secondary | ICD-10-CM | POA: Diagnosis not present

## 2019-07-12 DIAGNOSIS — F411 Generalized anxiety disorder: Secondary | ICD-10-CM | POA: Diagnosis not present

## 2019-07-17 ENCOUNTER — Ambulatory Visit: Payer: Self-pay | Attending: Internal Medicine

## 2019-07-17 DIAGNOSIS — Z23 Encounter for immunization: Secondary | ICD-10-CM | POA: Insufficient documentation

## 2019-07-17 NOTE — Progress Notes (Signed)
   Covid-19 Vaccination Clinic  Name:  Shelby Terry    MRN: [id number] DOB: 06-14-1990  07/17/2019  [name] was observed post Covid-19 immunization for 15 minutes without incident. She was provided with Vaccine Information Sheet and instruction to access the V-Safe system.   [name] was instructed to call 911 with any severe reactions post vaccine: Marland Kitchen Difficulty breathing  . Swelling of face and throat  . A fast heartbeat  . A bad rash all over body  . Dizziness and weakness   Immunizations Administered    Name Date Dose VIS Date Route   Pfizer COVID-19 Vaccine 07/17/2019  8:19 AM 0.3 mL 04/21/2019 Intramuscular   Manufacturer: [employer], [employer]   Lot: [id number]   NDC: [address]-9

## 2019-08-08 ENCOUNTER — Ambulatory Visit: Payer: Self-pay | Attending: Internal Medicine

## 2019-08-08 DIAGNOSIS — Z23 Encounter for immunization: Secondary | ICD-10-CM

## 2019-08-08 NOTE — Progress Notes (Signed)
   Covid-19 Vaccination Clinic  Name:  Shelby Terry    MRN: [id number] DOB: May 07, 1991  08/08/2019  Ms. Shelby Terry was observed post Covid-19 immunization for 15 minutes without incident. She was provided with Vaccine Information Sheet and instruction to access the V-Safe system.   Ms. Shelby Terry was instructed to call 911 with any severe reactions post vaccine: Marland Kitchen Difficulty breathing  . Swelling of face and throat  . A fast heartbeat  . A bad rash all over body  . Dizziness and weakness   Immunizations Administered    Name Date Dose VIS Date Route   Pfizer COVID-19 Vaccine 08/08/2019  3:39 PM 0.3 mL 04/21/2019 Intramuscular   Manufacturer: [employer], [employer]   Lot: [id number]   NDC: [address]-2

## 2019-11-06 ENCOUNTER — Other Ambulatory Visit: Payer: Self-pay | Admitting: Internal Medicine

## 2019-11-06 NOTE — Telephone Encounter (Signed)
Pt want to know if she can get enough medication until she se Dr.Tullo

## 2019-11-07 MED ORDER — AMITRIPTYLINE HCL 25 MG PO TABS: 25.0000 mg | ORAL_TABLET | Freq: Every day | ORAL | 0 refills | Status: DC

## 2019-11-07 MED ORDER — DULOXETINE HCL 40 MG PO CPEP: ORAL_CAPSULE | ORAL | 0 refills | Status: DC

## 2019-11-07 NOTE — Addendum Note (Signed)
Addended by: Sandy Salaam on: 11/07/2019 11:53 AM   Modules accepted: Orders

## 2019-11-09 DIAGNOSIS — F411 Generalized anxiety disorder: Secondary | ICD-10-CM | POA: Diagnosis not present

## 2019-11-09 DIAGNOSIS — F410 Panic disorder [episodic paroxysmal anxiety] without agoraphobia: Secondary | ICD-10-CM | POA: Diagnosis not present

## 2019-11-22 ENCOUNTER — Telehealth: Payer: Self-pay | Admitting: Internal Medicine

## 2019-11-22 ENCOUNTER — Ambulatory Visit: Payer: Self-pay | Admitting: Internal Medicine

## 2019-11-28 ENCOUNTER — Telehealth (INDEPENDENT_AMBULATORY_CARE_PROVIDER_SITE_OTHER): Payer: BC Managed Care – PPO | Admitting: Internal Medicine

## 2019-11-28 ENCOUNTER — Encounter: Payer: Self-pay | Admitting: Internal Medicine

## 2019-11-28 VITALS — Ht 65.0 in | Wt 150.0 lb

## 2019-11-28 DIAGNOSIS — E785 Hyperlipidemia, unspecified: Secondary | ICD-10-CM

## 2019-11-28 DIAGNOSIS — R748 Abnormal levels of other serum enzymes: Secondary | ICD-10-CM

## 2019-11-28 DIAGNOSIS — K580 Irritable bowel syndrome with diarrhea: Secondary | ICD-10-CM

## 2019-11-28 DIAGNOSIS — M797 Fibromyalgia: Secondary | ICD-10-CM | POA: Diagnosis not present

## 2019-11-28 MED ORDER — DULOXETINE HCL 40 MG PO CPEP: ORAL_CAPSULE | ORAL | 5 refills | Status: DC

## 2019-11-28 MED ORDER — AMITRIPTYLINE HCL 25 MG PO TABS: 25.0000 mg | ORAL_TABLET | Freq: Every day | ORAL | 2 refills | Status: DC

## 2019-11-28 NOTE — Assessment & Plan Note (Signed)
Improving  symptoms with low impact aerobics , cymbalta,  and reduction in stress .  No changes today

## 2019-11-28 NOTE — Patient Instructions (Signed)
I have refilled your cymbalta and your amitriptyline   I have ordered fasting labs  To get done at our office PRIOR to resuming birth control

## 2019-11-28 NOTE — Progress Notes (Signed)
Virtual Visit via [employer]  This visit type was conducted due to national recommendations for restrictions regarding the COVID-19 pandemic (e.g. social distancing).  This format is felt to be most appropriate for this patient at this time.  All issues noted in this document were discussed and addressed.  No physical exam was performed (except for noted visual exam findings with Video Visits).   I attempted to connect with@ on 11/28/19  4:30 PM EDT by a video enabled telemedicine application ; however  Interactive audio and video telecommunications  Ultimately failed, due to patient having technical difficulties.   We continued and completed visit with audio only. I and verified that I am speaking with the correct person using two identifiers.  Location patient: home Location provider: work or home office Persons participating in the virtual visit: patient, provider  I discussed the limitations, risks, security and privacy concerns of performing an evaluation and management service by telephone and the availability of in person appointments. I also discussed with the patient that there may be a patient responsible charge related to this service. The patient expressed understanding and agreed to proceed.  Reason for visit:  Follow up on IBS and fibromyalgia   HPI:  29 yr old female with  FM And IBS managed with cymbalta and elavil.   Last seen  In December at which time liver enzymes were mildly elevated.  Stopped OCPS for management of migraines.  Haven't been bad  Stress/anxiety Works at [employer].  Stressful job,  Additional responsibilities, somewhat better since getting COVID VACCINE.  Trying to restore balance to job. Eating and sleeping well.    Had a FM flare a few weeks ago that lasted about 24 hours and was almost incapacitating.  Occurred after several days of physically exhausting activity which included participating with mother [name], in a heavy workup at [facility], combined with  moving furniture during apartment relocation and increased activities at work .   Pain started after everything was done. And body reacted to the stress.   ROS: See pertinent positives and negatives per HPI.  Past Medical History:  Diagnosis Date  . Allergy   . Anxiety   . Chicken pox 1994  . Depression   . Fibromyalgia   . Interstitial cystitis   . Irritable bowel syndrome y-8  . Migraines 2008    No past surgical history on file.  Family History  Problem Relation Age of Onset  . Hyperlipidemia Mother   . Diabetes Mother   . Hyperlipidemia Father   . Hypertension Father   . Diabetes Father   . Colon polyps Father   . Cervical cancer Maternal Grandmother   . Hypertension Maternal Grandfather   . Hyperlipidemia Maternal Grandfather   . Colon polyps Maternal Grandfather   . Diabetes Maternal Grandfather   . Breast cancer Paternal Grandmother   . Lung cancer Paternal Grandfather   . Emphysema Paternal Grandfather   . Prostate cancer Maternal Uncle   . Breast cancer Paternal Aunt   . Diabetes Maternal Uncle   . Liver disease Maternal Uncle        x 2  . Asthma Brother     SOCIAL HX:  reports that she has never smoked. She has never used smokeless tobacco. She reports current alcohol use. She reports that she does not use drugs.   Current Outpatient Medications:  .  acetaminophen (TYLENOL) 325 MG tablet, Take 325 mg every 4 (four) hours as needed by mouth., Disp: , Rfl:  .  amitriptyline (ELAVIL) 25 MG tablet, Take 1 tablet (25 mg total) by mouth at bedtime., Disp: 90 tablet, Rfl: 2 .  DULoxetine HCl 40 MG CPEP, TAKE 1 CAPSULE BY MOUTH ONCE DAILY ., Disp: 30 capsule, Rfl: 5 .  triamcinolone cream (KENALOG) 0.1 %, Apply 1 application topically 2 (two) times daily., Disp: 30 g, Rfl: 2  EXAM:   General impression: alert, cooperative and articulate.  No signs of being in distress  Lungs: speech is fluent sentence length suggests that patient is not short of breath and  not punctuated by cough, sneezing or sniffing. Marland Kitchen   Psych: affect normal.  speech is articulate and non pressured .  Denies suicidal thoughts  PSYCH/NEURO: pleasant and cooperative, no obvious depression or anxiety, speech and thought processing grossly intact  ASSESSMENT AND PLAN:  Discussed the following assessment and plan:  Hyperlipidemia LDL goal <160 - Plan: Lipid panel  Elevated alkaline phosphatase level - Plan: Comprehensive metabolic panel  Fibromyalgia  Irritable bowel syndrome with diarrhea  Fibromyalgia Improving  symptoms with low impact aerobics , cymbalta,  and reduction in stress .  No changes today  IBS (irritable bowel syndrome) Currently well controlled with FodMaP diet  And amitriptyline .  medications refilled.     I discussed the assessment and treatment plan with the patient. The patient was provided an opportunity to ask questions and all were answered. The patient agreed with the plan and demonstrated an understanding of the instructions.   The patient was advised to call back or seek an in-person evaluation if the symptoms worsen or if the condition fails to improve as anticipated.  I provided 30 minutes of non-face-to-face time during this encounter.   Sherlene Shams, MD

## 2019-11-28 NOTE — Assessment & Plan Note (Signed)
Currently well controlled with FodMaP diet  And amitriptyline .  medications refilled.

## 2019-12-01 ENCOUNTER — Telehealth: Payer: Self-pay | Admitting: Internal Medicine

## 2019-12-01 NOTE — Telephone Encounter (Signed)
Lm on vm to call and make a fasting lab, per Dr. Darrick Huntsman.

## 2019-12-25 ENCOUNTER — Encounter: Payer: Self-pay | Admitting: Internal Medicine

## 2019-12-25 ENCOUNTER — Telehealth: Payer: BC Managed Care – PPO | Admitting: Internal Medicine

## 2019-12-25 DIAGNOSIS — H6690 Otitis media, unspecified, unspecified ear: Secondary | ICD-10-CM

## 2019-12-25 MED ORDER — LEVOFLOXACIN 500 MG PO TABS
500.0000 mg | ORAL_TABLET | Freq: Every day | ORAL | 0 refills | Status: DC
Start: 2019-12-25 — End: 2020-03-29

## 2019-12-25 MED ORDER — ONDANSETRON 4 MG PO TBDP
4.0000 mg | ORAL_TABLET | Freq: Three times a day (TID) | ORAL | 0 refills | Status: DC | PRN
Start: 2019-12-25 — End: 2020-05-13

## 2019-12-25 NOTE — Assessment & Plan Note (Signed)
Empiric treatment given based on history obtained by RN

## 2019-12-25 NOTE — Progress Notes (Signed)
Virtual Visit via Caregility  This visit type was conducted due to national recommendations for restrictions regarding the COVID-19 pandemic (e.g. social distancing).  This format is felt to be most appropriate for this patient at this time.  All issues noted in this document were discussed and addressed.  No physical exam was performed (except for noted visual exam findings with Video Visits).   I attempted to connect with@ on 12/25/19 at  1:15 PM EDT by a video enabled telemedicine application after my nurse verified that she was speaking with the correct person using two identifiers.  Interactive audio and video telecommunications were never  established between this provider and patient, which was apparently due to patient having sudden onset of nausea and vomiting Location patient: home Location provider: work or home office Persons participating in the virtual visit: patient, provider  Reason for visit:  Bilateral ear pain   HPI:  29 yr old female  Scheduled for virtual visit for evaluation of bilateral ear pain who unfortunately developed nausea and vomiting and was unable to return to the video visit during the 30 minute period  She was advised by nurse to get COVID testing given the progression of symptoms. I am sending zofran and levaquin to her pharmacy.

## 2019-12-27 ENCOUNTER — Ambulatory Visit: Payer: Self-pay

## 2019-12-27 ENCOUNTER — Telehealth: Payer: Self-pay | Admitting: Medical

## 2019-12-27 ENCOUNTER — Other Ambulatory Visit: Payer: Self-pay

## 2019-12-27 DIAGNOSIS — H9209 Otalgia, unspecified ear: Secondary | ICD-10-CM

## 2019-12-27 DIAGNOSIS — R110 Nausea: Secondary | ICD-10-CM

## 2019-12-27 DIAGNOSIS — R059 Cough, unspecified: Secondary | ICD-10-CM

## 2019-12-27 DIAGNOSIS — Z87898 Personal history of other specified conditions: Secondary | ICD-10-CM

## 2019-12-27 DIAGNOSIS — R05 Cough: Secondary | ICD-10-CM

## 2019-12-27 LAB — POC COVID19 BINAXNOW: SARS Coronavirus 2 Ag: NEGATIVE

## 2019-12-27 NOTE — Progress Notes (Signed)
Poc covid test was negative. Patient symptomatic. PCR covid test sent to [facility]. Murrell Redden, PAC to call patient with results and instructions.

## 2019-12-27 NOTE — Progress Notes (Signed)
   Subjective:    Patient ID: Shelby Terry, female    DOB: [birth year], 29 y.o.   MRN: [id number]  HPI 29 yo female in non acute distress, consents to telemedicine appointment.  Started with symptoms on Saturday 12/23/19. Her symptoms are ear pain on Monday nausea/ diarrhea,and abdominal pain lower right side, ended call with PCP because of diarrheas and nasal congestion MD  called in antibiotic for patient which she started yesterdayd.  Cough dry, possible fever and chills checked temp at home, 99.4 degrees.  Taking  Nothing for symptoms. There were no vitals taken for this visit.   Allergies  Allergen Reactions  . Aspirin Swelling    Throat swelling  . Ibuprofen Swelling    Throat swelling  . Penicillins Itching and Swelling    At site of injection only patient reports she has taken Amoxicillin/ Penicillin and Augmentin orally without any reaction or difficulty and reports she had a localized needle reaction.      Current Outpatient Medications:  .  acetaminophen (TYLENOL) 325 MG tablet, Take 325 mg every 4 (four) hours as needed by mouth., Disp: , Rfl:  .  amitriptyline (ELAVIL) 25 MG tablet, Take 1 tablet (25 mg total) by mouth at bedtime., Disp: 90 tablet, Rfl: 2 .  DULoxetine HCl 40 MG CPEP, TAKE 1 CAPSULE BY MOUTH ONCE DAILY ., Disp: 30 capsule, Rfl: 5 .  levofloxacin (LEVAQUIN) 500 MG tablet, Take 1 tablet (500 mg total) by mouth daily., Disp: 7 tablet, Rfl: 0 .  ondansetron (ZOFRAN ODT) 4 MG disintegrating tablet, Take 1 tablet (4 mg total) by mouth every 8 (eight) hours as needed for nausea or vomiting., Disp: 20 tablet, Rfl: 0 .  triamcinolone cream (KENALOG) 0.1 %, Apply 1 application topically 2 (two) times daily., Disp: 30 g, Rfl: 2 Review of Systems  Constitutional: Positive for fever (possible fever 99.4 at home). Negative for chills and fatigue.  HENT: Positive for congestion and ear pain (right). Negative for sinus pressure, sinus pain, sneezing and sore throat.    Respiratory: Positive for cough (dry) and chest tightness ("soreness"). Negative for shortness of breath and wheezing.   Cardiovascular: Positive for chest pain (little bit left side).  Gastrointestinal: Positive for abdominal pain, diarrhea and nausea. Negative for vomiting.  Musculoskeletal: Negative for myalgias.  Skin: Negative for rash.  Neurological: Positive for dizziness (little bit) and headaches (forehead). Negative for syncope and light-headedness.      No diarrhea since Monday, nausea  Continues with soup and bread. Objective:   Physical Exam  AXOX3  No physical exam preformed  Recent Results (from the past 2160 hour(s))  POC COVID-19     Status: Normal   Collection Time: 12/27/19  1:23 PM  Result Value Ref Range   SARS Coronavirus 2 Ag Negative Negative     PCR Covid-19 pending Assessment & Plan:  Cough, Nausea, right ear pain. Diarrhea resloved since Monday. Pending PCR Covid-19 test. Take Tylenol for fever and if not feeling well , per package instructions. Rest, Increase fluids,  Clear liquid diet x 24 hours if able to keep down advance to a bland diet (educational material sent). Gatorade if not eating or drinking well. Continue Levaquin and Zofran per her PCP.  Call clinic or your PCP if any of your symptoms worsen or any concerns. Patient verbalizes understanding and has no questions at the end of our conversation.

## 2019-12-27 NOTE — Patient Instructions (Signed)

## 2019-12-28 LAB — NOVEL CORONAVIRUS, NAA: SARS-CoV-2, NAA: NOT DETECTED

## 2019-12-28 LAB — SARS-COV-2, NAA 2 DAY TAT

## 2020-01-01 ENCOUNTER — Encounter: Payer: Self-pay | Admitting: Medical

## 2020-01-09 NOTE — Progress Notes (Signed)
Patient ID: Shelby Terry, female   DOB: Mar 31, 1991, 31 y.o.   MRN: [id number]  see note attached to nurses note.

## 2020-01-16 DIAGNOSIS — F410 Panic disorder [episodic paroxysmal anxiety] without agoraphobia: Secondary | ICD-10-CM | POA: Diagnosis not present

## 2020-01-16 DIAGNOSIS — F411 Generalized anxiety disorder: Secondary | ICD-10-CM | POA: Diagnosis not present

## 2020-01-18 DIAGNOSIS — R748 Abnormal levels of other serum enzymes: Secondary | ICD-10-CM

## 2020-01-18 DIAGNOSIS — E785 Hyperlipidemia, unspecified: Secondary | ICD-10-CM

## 2020-01-19 MED ORDER — DULOXETINE HCL 40 MG PO CPEP: ORAL_CAPSULE | ORAL | 1 refills | Status: DC

## 2020-01-23 DIAGNOSIS — F410 Panic disorder [episodic paroxysmal anxiety] without agoraphobia: Secondary | ICD-10-CM | POA: Diagnosis not present

## 2020-01-23 DIAGNOSIS — F411 Generalized anxiety disorder: Secondary | ICD-10-CM | POA: Diagnosis not present

## 2020-02-01 DIAGNOSIS — F410 Panic disorder [episodic paroxysmal anxiety] without agoraphobia: Secondary | ICD-10-CM | POA: Diagnosis not present

## 2020-02-01 DIAGNOSIS — F411 Generalized anxiety disorder: Secondary | ICD-10-CM | POA: Diagnosis not present

## 2020-02-07 DIAGNOSIS — F411 Generalized anxiety disorder: Secondary | ICD-10-CM | POA: Diagnosis not present

## 2020-02-07 DIAGNOSIS — F410 Panic disorder [episodic paroxysmal anxiety] without agoraphobia: Secondary | ICD-10-CM | POA: Diagnosis not present

## 2020-02-14 DIAGNOSIS — F410 Panic disorder [episodic paroxysmal anxiety] without agoraphobia: Secondary | ICD-10-CM | POA: Diagnosis not present

## 2020-02-14 DIAGNOSIS — F411 Generalized anxiety disorder: Secondary | ICD-10-CM | POA: Diagnosis not present

## 2020-02-20 ENCOUNTER — Other Ambulatory Visit: Payer: Self-pay | Admitting: Internal Medicine

## 2020-02-20 DIAGNOSIS — Z1231 Encounter for screening mammogram for malignant neoplasm of breast: Secondary | ICD-10-CM

## 2020-02-21 ENCOUNTER — Ambulatory Visit
Admission: RE | Admit: 2020-02-21 | Discharge: 2020-02-21 | Disposition: A | Discharge status: Home / Self Care | Payer: BC Managed Care – PPO | Source: Ambulatory Visit | Attending: Internal Medicine | Admitting: Internal Medicine

## 2020-02-21 ENCOUNTER — Other Ambulatory Visit: Payer: Self-pay

## 2020-02-21 DIAGNOSIS — F411 Generalized anxiety disorder: Secondary | ICD-10-CM | POA: Diagnosis not present

## 2020-02-21 DIAGNOSIS — Z1231 Encounter for screening mammogram for malignant neoplasm of breast: Secondary | ICD-10-CM | POA: Diagnosis not present

## 2020-02-21 DIAGNOSIS — F410 Panic disorder [episodic paroxysmal anxiety] without agoraphobia: Secondary | ICD-10-CM | POA: Diagnosis not present

## 2020-02-22 ENCOUNTER — Other Ambulatory Visit: Payer: Self-pay | Admitting: Internal Medicine

## 2020-02-22 DIAGNOSIS — N6489 Other specified disorders of breast: Secondary | ICD-10-CM

## 2020-02-22 DIAGNOSIS — R928 Other abnormal and inconclusive findings on diagnostic imaging of breast: Secondary | ICD-10-CM

## 2020-02-22 NOTE — Progress Notes (Signed)
[facility] has reported that your  mammogram was asymmetric  on  the left.   The radiologist is recommending additional images  and the facility will contact you directly to get those scheduled.  Please do not panic!  This happens quite frequently and more often in women with dense breasts., and DOES NOT mean you have Cancer!   The tone of the message you may receive from the facility is unfortunately meant to ensure that you return for the additional images.  If you do not hear from them by the end of the next week, please call my office  and I will get involved .   Regards,  Dr. Darrick Huntsman

## 2020-03-08 ENCOUNTER — Other Ambulatory Visit: Payer: Self-pay

## 2020-03-08 ENCOUNTER — Ambulatory Visit
Admission: RE | Admit: 2020-03-08 | Discharge: 2020-03-08 | Disposition: A | Discharge status: Home / Self Care | Payer: BC Managed Care – PPO | Source: Ambulatory Visit | Attending: Internal Medicine | Admitting: Internal Medicine

## 2020-03-08 DIAGNOSIS — R928 Other abnormal and inconclusive findings on diagnostic imaging of breast: Secondary | ICD-10-CM

## 2020-03-08 DIAGNOSIS — N6320 Unspecified lump in the left breast, unspecified quadrant: Secondary | ICD-10-CM | POA: Diagnosis not present

## 2020-03-08 DIAGNOSIS — R922 Inconclusive mammogram: Secondary | ICD-10-CM | POA: Diagnosis not present

## 2020-03-08 DIAGNOSIS — N6489 Other specified disorders of breast: Secondary | ICD-10-CM | POA: Diagnosis not present

## 2020-03-09 ENCOUNTER — Encounter: Payer: Self-pay | Admitting: Internal Medicine

## 2020-03-09 DIAGNOSIS — N6320 Unspecified lump in the left breast, unspecified quadrant: Secondary | ICD-10-CM | POA: Insufficient documentation

## 2020-03-09 NOTE — Progress Notes (Signed)
I received the results of your left breast ultrasound.  I'm not sure how comfortable you are with "probably benign" and "6 month follow up."  If you would like to see a breast surgeon for an exam and a 2nd opinion on whether waiting is the right thing to do,  I can make a referral to Dr. Almond Lint in [place].  I went to med school with her and trust her opinion.

## 2020-03-14 DIAGNOSIS — N6320 Unspecified lump in the left breast, unspecified quadrant: Secondary | ICD-10-CM

## 2020-03-14 DIAGNOSIS — R928 Other abnormal and inconclusive findings on diagnostic imaging of breast: Secondary | ICD-10-CM

## 2020-03-29 ENCOUNTER — Other Ambulatory Visit: Payer: Self-pay

## 2020-03-29 ENCOUNTER — Ambulatory Visit: Payer: BC Managed Care – PPO | Admitting: Registered Nurse

## 2020-03-29 ENCOUNTER — Telehealth: Payer: Self-pay | Admitting: Registered Nurse

## 2020-03-29 VITALS — Resp 16

## 2020-03-29 DIAGNOSIS — Z20822 Contact with and (suspected) exposure to covid-19: Secondary | ICD-10-CM

## 2020-03-29 DIAGNOSIS — J029 Acute pharyngitis, unspecified: Secondary | ICD-10-CM

## 2020-03-29 DIAGNOSIS — F411 Generalized anxiety disorder: Secondary | ICD-10-CM | POA: Diagnosis not present

## 2020-03-29 DIAGNOSIS — F4312 Post-traumatic stress disorder, chronic: Secondary | ICD-10-CM | POA: Diagnosis not present

## 2020-03-29 LAB — POC COVID19 BINAXNOW: SARS Coronavirus 2 Ag: NEGATIVE

## 2020-03-29 MED ORDER — FLUTICASONE PROPIONATE 50 MCG/ACT NA SUSP
1.0000 | Freq: Two times a day (BID) | NASAL | 0 refills | Status: DC
Start: 2020-03-29 — End: 2020-05-13

## 2020-03-29 MED ORDER — SALINE SPRAY 0.65 % NA SOLN: 2.0000 | NASAL | 0 refills | Status: DC

## 2020-03-29 MED ORDER — OMEPRAZOLE 20 MG PO CPDR: 20.0000 mg | DELAYED_RELEASE_CAPSULE | Freq: Every day | ORAL | 0 refills | Status: DC

## 2020-03-29 NOTE — Telephone Encounter (Signed)
See telehealth note dated today encounter opened to send my chart message. 

## 2020-03-29 NOTE — Patient Instructions (Addendum)
COVID-19: What Your Test Results Mean If you test positive for COVID-19 Take steps to help prevent the spread of COVID-19 Stay home.  Do not leave your home, except to get medical care. Do not visit public areas. Get rest and stay hydrated. Take over-the-counter medicines, such as acetaminophen, to help you feel better. Stay in touch with your doctor. Separate yourself from other people.  As much as possible, stay in a specific room and away from other people and pets in your home. If you test negative for COVID-19  You probably were not infected at the time your sample was collected.  However, that does not mean you will not get sick.  It is possible that you were very early in your infection when your sample was collected and that you could test positive later. A negative test result does not mean you won't get sick later. [email or web address] 10/08/2018 This information is not intended to replace advice given to you by your health care provider. Make sure you discuss any questions you have with your health care provider. Document Revised: 04/13/2019 Document Reviewed: 04/13/2019 Elsevier Patient Education  2020 Elsevier Inc. Pharyngitis  Pharyngitis is redness, pain, and swelling (inflammation) of the throat (pharynx). It is a very common cause of sore throat. Pharyngitis can be caused by a bacteria, but it is usually caused by a virus. Most cases of pharyngitis get better on their own without treatment. What are the causes? This condition may be caused by:  Infection by viruses (viral). Viral pharyngitis spreads from person to person (is contagious) through coughing, sneezing, and sharing of personal items or utensils such as cups, forks, spoons, and toothbrushes.  Infection by bacteria (bacterial). Bacterial pharyngitis may be spread by touching the nose or face after coming in contact with the bacteria, or through more intimate contact, such as kissing.  Allergies. Allergies  can cause buildup of mucus in the throat (post-nasal drip), leading to inflammation and irritation. Allergies can also cause blocked nasal passages, forcing breathing through the mouth, which dries and irritates the throat. What increases the risk? You are more likely to develop this condition if:  You are 16-71 years old.  You are exposed to crowded environments such as daycare, school, or dormitory living.  You live in a cold climate.  You have a weakened disease-fighting (immune) system. What are the signs or symptoms? Symptoms of this condition vary by the cause (viral, bacterial, or allergies) and can include:  Sore throat.  Fatigue.  Low-grade fever.  Headache.  Joint pain and muscle aches.  Skin rashes.  Swollen glands in the throat (lymph nodes).  Plaque-like film on the throat or tonsils. This is often a symptom of bacterial pharyngitis.  Vomiting.  Stuffy nose (nasal congestion).  Cough.  Red, itchy eyes (conjunctivitis).  Loss of appetite. How is this diagnosed? This condition is often diagnosed based on your medical history and a physical exam. Your health care provider will ask you questions about your illness and your symptoms. A swab of your throat may be done to check for bacteria (rapid strep test). Other lab tests may also be done, depending on the suspected cause, but these are rare. How is this treated? This condition usually gets better in 3-4 days without medicine. Bacterial pharyngitis may be treated with antibiotic medicines. Follow these instructions at home:  Take over-the-counter and prescription medicines only as told by your health care provider. ? If you were prescribed an antibiotic medicine, take it as told by  your health care provider. Do not stop taking the antibiotic even if you start to feel better. ? Do not give children aspirin because of the association with Reye syndrome.  Drink enough water and fluids to keep your urine clear or  pale yellow.  Get a lot of rest.  Gargle with a salt-water mixture 3-4 times a day or as needed. To make a salt-water mixture, completely dissolve -1 tsp of salt in 1 cup of warm water.  If your health care provider approves, you may use throat lozenges or sprays to soothe your throat. Contact a health care provider if:  You have large, tender lumps in your neck.  You have a rash.  You cough up green, yellow-brown, or bloody spit. Get help right away if:  Your neck becomes stiff.  You drool or are unable to swallow liquids.  You cannot drink or take medicines without vomiting.  You have severe pain that does not go away, even after you take medicine.  You have trouble breathing, and it is not caused by a stuffy nose.  You have new pain and swelling in your joints such as the knees, ankles, wrists, or elbows. Summary  Pharyngitis is redness, pain, and swelling (inflammation) of the throat (pharynx).  While pharyngitis can be caused by a bacteria, the most common causes are viral.  Most cases of pharyngitis get better on their own without treatment.  Bacterial pharyngitis is treated with antibiotic medicines. This information is not intended to replace advice given to you by your health care provider. Make sure you discuss any questions you have with your health care provider. Document Revised: 04/09/2017 Document Reviewed: 06/02/2016 Elsevier Patient Education  2020 Elsevier Inc. Nonallergic Rhinitis Nonallergic rhinitis is a condition that causes symptoms that affect the nose, such as a runny nose and a stuffed-up nose (nasal congestion) that can make it hard to breathe through the nose. This condition is different from having an allergy (allergic rhinitis). Allergic rhinitis occurs when the body's defense system (immune system) reacts to a substance that you are allergic to (allergen), such as pollen, pet dander, mold, or dust. Nonallergic rhinitis has many similar  symptoms, but it is not caused by allergens. Nonallergic rhinitis can be a short-term or long-term problem. What are the causes? This condition can be caused by many different things. Some common types of nonallergic rhinitis include: Infectious rhinitis  This is usually due to an infection in the upper respiratory tract. Vasomotor rhinitis  This is the most common type of long-term nonallergic rhinitis.  It is caused by too much blood flow through the nose, which makes the tissue inside of the nose swell.  Symptoms are often triggered by strong odors, cold air, stress, drinking alcohol, cigarette smoke, or changes in the weather. Occupational rhinitis  This type is caused by triggers in the workplace, such as chemicals, dusts, animal dander, or air pollution. Hormonal rhinitis  This type occurs in women as a result of an increase in the female hormone estrogen.  It may occur during pregnancy, puberty, and menstrual cycles.  Symptoms improve when estrogen levels drop. Drug-induced rhinitis Several drugs can cause nonallergic rhinitis, including:  Medicines that are used to treat high blood pressure, heart disease, and Parkinson disease.  Aspirin and NSAIDs.  Over-the-counter nasal decongestant sprays. These can cause a type of nonallergic rhinitis (rhinitis medicamentosa) when they are used for more than a few days. Nonallergic rhinitis with eosinophilia syndrome (NARES)  This type is caused by  having too much of a certain type of white blood cell (eosinophil). Nonallergic rhinitis can also be caused by a reaction to eating hot or spicy foods. This does not usually cause long-term symptoms. In some cases, the cause of nonallergic rhinitis is not known. What increases the risk? You are more likely to develop this condition if:  You are 22-26 years of age.  You are a woman. Women are twice as likely to have this condition. What are the signs or symptoms? Common symptoms of  this condition include:  Nasal congestion.  Runny nose.  The feeling of mucus going down the back of the throat (postnasal drip).  Trouble sleeping at night and daytime sleepiness. Less common symptoms include:  Sneezing.  Coughing.  Itchy nose.  Bloodshot eyes. How is this diagnosed? This condition may be diagnosed based on:  Your symptoms and medical history.  A physical exam.  Allergy testing to rule out allergic rhinitis. You may have skin tests or blood tests. In some cases, the health care provider may take a swab of nasal secretions to look for an increased number of eosinophils. This would be done to confirm a diagnosis of NARES. How is this treated? Treatment for this condition depends on the cause. No single treatment works for everyone. Work with your health care provider to find the best treatment for you. Treatment may include:  Avoiding the things that trigger your symptoms.  Using medicines to relieve congestion, such as: ? Steroid nasal spray. There are many types. You may need to try a few to find out which one works best. ? Decongestant medicine. This may be an oral medicine or a nasal spray. These medicines are only used for a short time.  Using medicines to relieve a runny nose. These may include antihistamine medicines or anticholinergic nasal sprays.  Surgery to remove tissue from inside the nose may be needed in severe cases if the condition has not improved after 6-12 months of medical treatment. Follow these instructions at home:  Take or use over-the-counter and prescription medicines only as told by your health care provider. Do not stop using your medicine even if you start to feel better.  Use salt-water (saline) rinses or other solutions (nasal washes or irrigations) to wash or rinse out the inside of your nose as told by your health care provider.  Do not take NSAIDs or medicines that contain aspirin if they make your symptoms worse.  Do  not drink alcohol if it makes your symptoms worse.  Do not use any tobacco products, such as cigarettes, chewing tobacco, and e-cigarettes. If you need help quitting, ask your health care provider.  Avoid secondhand smoke.  Get some exercise every day. Exercise may help reduce symptoms of nonallergic rhinitis for some people. Ask your health care provider how much exercise and what types of exercise are safe for you.  Sleep with the head of your bed raised (elevated). This may reduce nighttime nasal congestion.  Keep all follow-up visits as told by your health care provider. This is important. Contact a health care provider if:  You have a fever.  Your symptoms are getting worse at home.  Your symptoms are not responding to medicine.  You develop new symptoms, especially a headache or nosebleed. This information is not intended to replace advice given to you by your health care provider. Make sure you discuss any questions you have with your health care provider. Document Revised: 04/09/2017 Document Reviewed: 07/18/2015 Elsevier Patient Education  2020 Elsevier Inc. Allergic Rhinitis, Adult Allergic rhinitis is an allergic reaction that affects the mucous membrane inside the nose. It causes sneezing, a runny or stuffy nose, and the feeling of mucus going down the back of the throat (postnasal drip). Allergic rhinitis can be mild to severe. There are two types of allergic rhinitis:  Seasonal. This type is also called hay fever. It happens only during certain seasons.  Perennial. This type can happen at any time of the year. What are the causes? This condition happens when the body's defense system (immune system) responds to certain harmless substances called allergens as though they were germs.  Seasonal allergic rhinitis is triggered by pollen, which can come from grasses, trees, and weeds. Perennial allergic rhinitis may be caused by:  House dust mites.  Pet dander.  Mold  spores. What are the signs or symptoms? Symptoms of this condition include:  Sneezing.  Runny or stuffy nose (nasal congestion).  Postnasal drip.  Itchy nose.  Tearing of the eyes.  Trouble sleeping.  Daytime sleepiness. How is this diagnosed? This condition may be diagnosed based on:  Your medical history.  A physical exam.  Tests to check for related conditions, such as: ? Asthma. ? Pink eye. ? Ear infection. ? Upper respiratory infection.  Tests to find out which allergens trigger your symptoms. These may include skin or blood tests. How is this treated? There is no cure for this condition, but treatment can help control symptoms. Treatment may include:  Taking medicines that block allergy symptoms, such as antihistamines. Medicine may be given as a shot, nasal spray, or pill.  Avoiding the allergen.  Desensitization. This treatment involves getting ongoing shots until your body becomes less sensitive to the allergen. This treatment may be done if other treatments do not help.  If taking medicine and avoiding the allergen does not work, new, stronger medicines may be prescribed. Follow these instructions at home:  Find out what you are allergic to. Common allergens include smoke, dust, and pollen.  Avoid the things you are allergic to. These are some things you can do to help avoid allergens: ? Replace carpet with wood, tile, or vinyl flooring. Carpet can trap dander and dust. ? Do not smoke. Do not allow smoking in your home. ? Change your heating and air conditioning filter at least once a month. ? During allergy season:  Keep windows closed as much as possible.  Plan outdoor activities when pollen counts are lowest. This is usually during the evening hours.  When coming indoors, change clothing and shower before sitting on furniture or bedding.  Take over-the-counter and prescription medicines only as told by your health care provider.  Keep all  follow-up visits as told by your health care provider. This is important. Contact a health care provider if:  You have a fever.  You develop a persistent cough.  You make whistling sounds when you breathe (you wheeze).  Your symptoms interfere with your normal daily activities. Get help right away if:  You have shortness of breath. Summary  This condition can be managed by taking medicines as directed and avoiding allergens.  Contact your health care provider if you develop a persistent cough or fever.  During allergy season, keep windows closed as much as possible. This information is not intended to replace advice given to you by your health care provider. Make sure you discuss any questions you have with your health care provider. Document Revised: 04/09/2017 Document Reviewed: 06/04/2016 Elsevier  Patient Education  [employer]2020 Else[employer] Inc. How to Perform a Sinus Rinse A sinus rinse is a home treatment that is used to rinse your sinuses with a sterile mixture of salt and water (saline solution). Sinuses are air-filled spaces in your skull behind the bones of your face and forehead that open into your nasal cavity. A sinus rinse can help to clear mucus, dirt, dust, or pollen from your nasal cavity. You may do a sinus rinse when you have a cold, a virus, nasal allergy symptoms, a sinus infection, or stuffiness in your nose or sinuses. Talk with your health care provider about whether a sinus rinse might help you. What are the risks? A sinus rinse is generally safe and effective. However, there are a few risks, which include:  A burning sensation in your sinuses. This may happen if you do not make the saline solution as directed. Be sure to follow all directions when making the saline solution.  Nasal irritation.  Infection from contaminated water. This is rare, but possible. Do not do a sinus rinse if you have had ear or nasal surgery, ear infection, or blocked ears. Supplies  needed:  Saline solution or powder.  Distilled or sterile water may be needed to mix with saline powder. ? You may use boiled and cooled tap water. Boil tap water for 5 minutes; cool until it is lukewarm. Use within 24 hours. ? Do not use regular tap water to mix with the saline solution.  Neti pot or nasal rinse bottle. These supplies release the saline solution into your nose and through your sinuses. Neti pots and nasal rinse bottles can be purchased at [facility] local [facility], a health food store, or online. How to perform a sinus rinse  1. Wash your hands with soap and water. 2. Wash your device according to the directions that came with the product and then dry it. 3. Use the solution that comes with your product or one that is sold separately in stores. Follow the mixing directions on the package if you need to mix with sterile or distilled water. 4. Fill the device with the amount of saline solution noted in the device instructions. 5. Stand over a sink and tilt your head sideways over the sink. 6. Place the spout of the device in your upper nostril (the one closer to the ceiling). 7. Gently pour or squeeze the saline solution into your nasal cavity. The liquid should drain out from the lower nostril if you are not too congested. 8. While rinsing, breathe through your open mouth. 9. Gently blow your nose to clear any mucus and rinse solution. Blowing too hard may cause ear pain. 10. Repeat in your other nostril. 11. Clean and rinse your device with clean water and then air-dry it. Talk with your health care provider or pharmacist if you have questions about how to do a sinus rinse. Summary  A sinus rinse is a home treatment that is used to rinse your sinuses with a sterile mixture of salt and water (saline solution).  A sinus rinse is generally safe and effective. Follow all instructions carefully.  Before doing a sinus rinse, talk with your health care provider about whether it  would be helpful for you. This information is not intended to replace advice given to you by your health care provider. Make sure you discuss any questions you have with your health care provider. Document Revised: 02/22/2017 Document Reviewed: 02/22/2017 Elsevier Patient Education  2020 [employer]  Gastroesophageal  Reflux Disease, Adult Gastroesophageal reflux (GER) happens when acid from the stomach flows up into the tube that connects the mouth and the stomach (esophagus). Normally, food travels down the esophagus and stays in the stomach to be digested. However, when a person has GER, food and stomach acid sometimes move back up into the esophagus. If this becomes a more serious problem, the person may be diagnosed with a disease called gastroesophageal reflux disease (GERD). GERD occurs when the reflux:  Happens often.  Causes frequent or severe symptoms.  Causes problems such as damage to the esophagus. When stomach acid comes in contact with the esophagus, the acid may cause soreness (inflammation) in the esophagus. Over time, GERD may create small holes (ulcers) in the lining of the esophagus. What are the causes? This condition is caused by a problem with the muscle between the esophagus and the stomach (lower esophageal sphincter, or LES). Normally, the LES muscle closes after food passes through the esophagus to the stomach. When the LES is weakened or abnormal, it does not close properly, and that allows food and stomach acid to go back up into the esophagus. The LES can be weakened by certain dietary substances, medicines, and medical conditions, including:  Tobacco use.  Pregnancy.  Having a hiatal hernia.  Alcohol use.  Certain foods and beverages, such as coffee, chocolate, onions, and peppermint. What increases the risk? You are more likely to develop this condition if you:  Have an increased body weight.  Have a connective tissue disorder.  Use NSAID  medicines. What are the signs or symptoms? Symptoms of this condition include:  Heartburn.  Difficult or painful swallowing.  The feeling of having a lump in the throat.  Abitter taste in the mouth.  Bad breath.  Having a large amount of saliva.  Having an upset or bloated stomach.  Belching.  Chest pain. Different conditions can cause chest pain. Make sure you see your health care provider if you experience chest pain.  Shortness of breath or wheezing.  Ongoing (chronic) cough or a night-time cough.  Wearing away of tooth enamel.  Weight loss. How is this diagnosed? Your health care provider will take a medical history and perform a physical exam. To determine if you have mild or severe GERD, your health care provider may also monitor how you respond to treatment. You may also have tests, including:  A test to examine your stomach and esophagus with a small camera (endoscopy).  A test thatmeasures the acidity level in your esophagus.  A test thatmeasures how much pressure is on your esophagus.  A barium swallow or modified barium swallow test to show the shape, size, and functioning of your esophagus. How is this treated? The goal of treatment is to help relieve your symptoms and to prevent complications. Treatment for this condition may vary depending on how severe your symptoms are. Your health care provider may recommend:  Changes to your diet.  Medicine.  Surgery. Follow these instructions at home: Eating and drinking   Follow a diet as recommended by your health care provider. This may involve avoiding foods and drinks such as: ? Coffee and tea (with or without caffeine). ? Drinks that containalcohol. ? Energy drinks and sports drinks. ? Carbonated drinks or sodas. ? Chocolate and cocoa. ? Peppermint and mint flavorings. ? Garlic and onions. ? Horseradish. ? Spicy and acidic foods, including peppers, chili powder, curry powder, vinegar, hot  sauces, and barbecue sauce. ? Citrus fruit juices and citrus  fruits, such as oranges, lemons, and limes. ? Tomato-based foods, such as red sauce, chili, salsa, and pizza with red sauce. ? Fried and fatty foods, such as donuts, french fries, potato chips, and high-fat dressings. ? High-fat meats, such as hot dogs and fatty cuts of red and white meats, such as rib eye steak, sausage, ham, and bacon. ? High-fat dairy items, such as whole milk, butter, and cream cheese.  Eat small, frequent meals instead of large meals.  Avoid drinking large amounts of liquid with your meals.  Avoid eating meals during the 2-3 hours before bedtime.  Avoid lying down right after you eat.  Do not exercise right after you eat. Lifestyle   Do not use any products that contain nicotine or tobacco, such as cigarettes, e-cigarettes, and chewing tobacco. If you need help quitting, ask your health care provider.  Try to reduce your stress by using methods such as yoga or meditation. If you need help reducing stress, ask your health care provider.  If you are overweight, reduce your weight to an amount that is healthy for you. Ask your health care provider for guidance about a safe weight loss goal. General instructions  Pay attention to any changes in your symptoms.  Take over-the-counter and prescription medicines only as told by your health care provider. Do not take aspirin, ibuprofen, or other NSAIDs unless your health care provider told you to do so.  Wear loose-fitting clothing. Do not wear anything tight around your waist that causes pressure on your abdomen.  Raise (elevate) the head of your bed about 6 inches (15 cm).  Avoid bending over if this makes your symptoms worse.  Keep all follow-up visits as told by your health care provider. This is important. Contact a health care provider if:  You have: ? New symptoms. ? Unexplained weight loss. ? Difficulty swallowing or it hurts to  swallow. ? Wheezing or a persistent cough. ? A hoarse voice.  Your symptoms do not improve with treatment. Get help right away if you:  Have pain in your arms, neck, jaw, teeth, or back.  Feel sweaty, dizzy, or light-headed.  Have chest pain or shortness of breath.  Vomit and your vomit looks like blood or coffee grounds.  Faint.  Have stool that is bloody or black.  Cannot swallow, drink, or eat. Summary  Gastroesophageal reflux happens when acid from the stomach flows up into the esophagus. GERD is a disease in which the reflux happens often, causes frequent or severe symptoms, or causes problems such as damage to the esophagus.  Treatment for this condition may vary depending on how severe your symptoms are. Your health care provider may recommend diet and lifestyle changes, medicine, or surgery.  Contact a health care provider if you have new or worsening symptoms.  Take over-the-counter and prescription medicines only as told by your health care provider. Do not take aspirin, ibuprofen, or other NSAIDs unless your health care provider told you to do so.  Keep all follow-up visits as told by your health care provider. This is important. This information is not intended to replace advice given to you by your health care provider. Make sure you discuss any questions you have with your health care provider. Document Revised: 11/03/2017 Document Reviewed: 11/03/2017 Elsevier Patient Education  2020 [employer].  How to Perform a Sinus Rinse A sinus rinse is a home treatment that is used to rinse your sinuses with a sterile mixture of salt and water (saline solution).  Sinuses are air-filled spaces in your skull behind the bones of your face and forehead that open into your nasal cavity. A sinus rinse can help to clear mucus, dirt, dust, or pollen from your nasal cavity. You may do a sinus rinse when you have a cold, a virus, nasal allergy symptoms, a sinus infection, or  stuffiness in your nose or sinuses. Talk with your health care provider about whether a sinus rinse might help you. What are the risks? A sinus rinse is generally safe and effective. However, there are a few risks, which include:  A burning sensation in your sinuses. This may happen if you do not make the saline solution as directed. Be sure to follow all directions when making the saline solution.  Nasal irritation.  Infection from contaminated water. This is rare, but possible. Do not do a sinus rinse if you have had ear or nasal surgery, ear infection, or blocked ears. Supplies needed:  Saline solution or powder.  Distilled or sterile water may be needed to mix with saline powder. ? You may use boiled and cooled tap water. Boil tap water for 5 minutes; cool until it is lukewarm. Use within 24 hours. ? Do not use regular tap water to mix with the saline solution.  Neti pot or nasal rinse bottle. These supplies release the saline solution into your nose and through your sinuses. Neti pots and nasal rinse bottles can be purchased at [employer], a health food store, or online. How to perform a sinus rinse  12. Wash your hands with soap and water. 13. Wash your device according to the directions that came with the product and then dry it. 14. Use the solution that comes with your product or one that is sold separately in stores. Follow the mixing directions on the package if you need to mix with sterile or distilled water. 15. Fill the device with the amount of saline solution noted in the device instructions. 16. Stand over a sink and tilt your head sideways over the sink. 17. Place the spout of the device in your upper nostril (the one closer to the ceiling). 18. Gently pour or squeeze the saline solution into your nasal cavity. The liquid should drain out from the lower nostril if you are not too congested. 19. While rinsing, breathe through your open mouth. 20. Gently blow  your nose to clear any mucus and rinse solution. Blowing too hard may cause ear pain. 21. Repeat in your other nostril. 22. Clean and rinse your device with clean water and then air-dry it. Talk with your health care provider or pharmacist if you have questions about how to do a sinus rinse. Summary  A sinus rinse is a home treatment that is used to rinse your sinuses with a sterile mixture of salt and water (saline solution).  A sinus rinse is generally safe and effective. Follow all instructions carefully.  Before doing a sinus rinse, talk with your health care provider about whether it would be helpful for you. This information is not intended to replace advice given to you by your health care provider. Make sure you discuss any questions you have with your health care provider. Document Revised: 02/22/2017 Document Reviewed: 02/22/2017 Elsevier Patient Education  2020 [employer]  Nonallergic Rhinitis Nonallergic rhinitis is a condition that causes symptoms that affect the nose, such as a runny nose and a stuffed-up nose (nasal congestion) that can make it hard to breathe through the nose. This condition  is different from having an allergy (allergic rhinitis). Allergic rhinitis occurs when the body's defense system (immune system) reacts to a substance that you are allergic to (allergen), such as pollen, pet dander, mold, or dust. Nonallergic rhinitis has many similar symptoms, but it is not caused by allergens. Nonallergic rhinitis can be a short-term or long-term problem. What are the causes? This condition can be caused by many different things. Some common types of nonallergic rhinitis include: Infectious rhinitis  This is usually due to an infection in the upper respiratory tract. Vasomotor rhinitis  This is the most common type of long-term nonallergic rhinitis.  It is caused by too much blood flow through the nose, which makes the tissue inside of the nose swell.  Symptoms  are often triggered by strong odors, cold air, stress, drinking alcohol, cigarette smoke, or changes in the weather. Occupational rhinitis  This type is caused by triggers in the workplace, such as chemicals, dusts, animal dander, or air pollution. Hormonal rhinitis  This type occurs in women as a result of an increase in the female hormone estrogen.  It may occur during pregnancy, puberty, and menstrual cycles.  Symptoms improve when estrogen levels drop. Drug-induced rhinitis Several drugs can cause nonallergic rhinitis, including:  Medicines that are used to treat high blood pressure, heart disease, and Parkinson disease.  Aspirin and NSAIDs.  Over-the-counter nasal decongestant sprays. These can cause a type of nonallergic rhinitis (rhinitis medicamentosa) when they are used for more than a few days. Nonallergic rhinitis with eosinophilia syndrome (NARES)  This type is caused by having too much of a certain type of white blood cell (eosinophil). Nonallergic rhinitis can also be caused by a reaction to eating hot or spicy foods. This does not usually cause long-term symptoms. In some cases, the cause of nonallergic rhinitis is not known. What increases the risk? You are more likely to develop this condition if:  You are 32-42 years of age.  You are a woman. Women are twice as likely to have this condition. What are the signs or symptoms? Common symptoms of this condition include:  Nasal congestion.  Runny nose.  The feeling of mucus going down the back of the throat (postnasal drip).  Trouble sleeping at night and daytime sleepiness. Less common symptoms include:  Sneezing.  Coughing.  Itchy nose.  Bloodshot eyes. How is this diagnosed? This condition may be diagnosed based on:  Your symptoms and medical history.  A physical exam.  Allergy testing to rule out allergic rhinitis. You may have skin tests or blood tests. In some cases, the health care provider  may take a swab of nasal secretions to look for an increased number of eosinophils. This would be done to confirm a diagnosis of NARES. How is this treated? Treatment for this condition depends on the cause. No single treatment works for everyone. Work with your health care provider to find the best treatment for you. Treatment may include:  Avoiding the things that trigger your symptoms.  Using medicines to relieve congestion, such as: ? Steroid nasal spray. There are many types. You may need to try a few to find out which one works best. ? Decongestant medicine. This may be an oral medicine or a nasal spray. These medicines are only used for a short time.  Using medicines to relieve a runny nose. These may include antihistamine medicines or anticholinergic nasal sprays.  Surgery to remove tissue from inside the nose may be needed in severe cases if  the condition has not improved after 6-12 months of medical treatment. Follow these instructions at home:  Take or use over-the-counter and prescription medicines only as told by your health care provider. Do not stop using your medicine even if you start to feel better.  Use salt-water (saline) rinses or other solutions (nasal washes or irrigations) to wash or rinse out the inside of your nose as told by your health care provider.  Do not take NSAIDs or medicines that contain aspirin if they make your symptoms worse.  Do not drink alcohol if it makes your symptoms worse.  Do not use any tobacco products, such as cigarettes, chewing tobacco, and e-cigarettes. If you need help quitting, ask your health care provider.  Avoid secondhand smoke.  Get some exercise every day. Exercise may help reduce symptoms of nonallergic rhinitis for some people. Ask your health care provider how much exercise and what types of exercise are safe for you.  Sleep with the head of your bed raised (elevated). This may reduce nighttime nasal congestion.  Keep all  follow-up visits as told by your health care provider. This is important. Contact a health care provider if:  You have a fever.  Your symptoms are getting worse at home.  Your symptoms are not responding to medicine.  You develop new symptoms, especially a headache or nosebleed. This information is not intended to replace advice given to you by your health care provider. Make sure you discuss any questions you have with your health care provider. Document Revised: 04/09/2017 Document Reviewed: 07/18/2015 Elsevier Patient Education  2020 [employer]. Allergic Rhinitis, Adult Allergic rhinitis is an allergic reaction that affects the mucous membrane inside the nose. It causes sneezing, a runny or stuffy nose, and the feeling of mucus going down the back of the throat (postnasal drip). Allergic rhinitis can be mild to severe. There are two types of allergic rhinitis:  Seasonal. This type is also called hay fever. It happens only during certain seasons.  Perennial. This type can happen at any time of the year. What are the causes? This condition happens when the body's defense system (immune system) responds to certain harmless substances called allergens as though they were germs.  Seasonal allergic rhinitis is triggered by pollen, which can come from grasses, trees, and weeds. Perennial allergic rhinitis may be caused by:  House dust mites.  Pet dander.  Mold spores. What are the signs or symptoms? Symptoms of this condition include:  Sneezing.  Runny or stuffy nose (nasal congestion).  Postnasal drip.  Itchy nose.  Tearing of the eyes.  Trouble sleeping.  Daytime sleepiness. How is this diagnosed? This condition may be diagnosed based on:  Your medical history.  A physical exam.  Tests to check for related conditions, such as: ? Asthma. ? Pink eye. ? Ear infection. ? Upper respiratory infection.  Tests to find out which allergens trigger your symptoms. These  may include skin or blood tests. How is this treated? There is no cure for this condition, but treatment can help control symptoms. Treatment may include:  Taking medicines that block allergy symptoms, such as antihistamines. Medicine may be given as a shot, nasal spray, or pill.  Avoiding the allergen.  Desensitization. This treatment involves getting ongoing shots until your body becomes less sensitive to the allergen. This treatment may be done if other treatments do not help.  If taking medicine and avoiding the allergen does not work, new, stronger medicines may be prescribed. Follow these  instructions at home:  Find out what you are allergic to. Common allergens include smoke, dust, and pollen.  Avoid the things you are allergic to. These are some things you can do to help avoid allergens: ? Replace carpet with wood, tile, or vinyl flooring. Carpet can trap dander and dust. ? Do not smoke. Do not allow smoking in your home. ? Change your heating and air conditioning filter at least once a month. ? During allergy season:  Keep windows closed as much as possible.  Plan outdoor activities when pollen counts are lowest. This is usually during the evening hours.  When coming indoors, change clothing and shower before sitting on furniture or bedding.  Take over-the-counter and prescription medicines only as told by your health care provider.  Keep all follow-up visits as told by your health care provider. This is important. Contact a health care provider if:  You have a fever.  You develop a persistent cough.  You make whistling sounds when you breathe (you wheeze).  Your symptoms interfere with your normal daily activities. Get help right away if:  You have shortness of breath. Summary  This condition can be managed by taking medicines as directed and avoiding allergens.  Contact your health care provider if you develop a persistent cough or fever.  During allergy  season, keep windows closed as much as possible. This information is not intended to replace advice given to you by your health care provider. Make sure you discuss any questions you have with your health care provider. Document Revised: 04/09/2017 Document Reviewed: 06/04/2016 Elsevier Patient Education  2020 [employer] Inc[employer]  Pharyngitis  Pharyngitis is redness, pain, and swelling (inflammation) of the throat (pharynx). It is a very common cause of sore throat. Pharyngitis can be caused by a bacteria, but it is usually caused by a virus. Most cases of pharyngitis get better on their own without treatment. What are the causes? This condition may be caused by:  Infection by viruses (viral). Viral pharyngitis spreads from person to person (is contagious) through coughing, sneezing, and sharing of personal items or utensils such as cups, forks, spoons, and toothbrushes.  Infection by bacteria (bacterial). Bacterial pharyngitis may be spread by touching the nose or face after coming in contact with the bacteria, or through more intimate contact, such as kissing.  Allergies. Allergies can cause buildup of mucus in the throat (post-nasal drip), leading to inflammation and irritation. Allergies can also cause blocked nasal passages, forcing breathing through the mouth, which dries and irritates the throat. What increases the risk? You are more likely to develop this condition if:  You are 42-35 years old.  You are exposed to crowded environments such as daycare, school, or dormitory living.  You live in a cold climate.  You have a weakened disease-fighting (immune) system. What are the signs or symptoms? Symptoms of this condition vary by the cause (viral, bacterial, or allergies) and can include:  Sore throat.  Fatigue.  Low-grade fever.  Headache.  Joint pain and muscle aches.  Skin rashes.  Swollen glands in the throat (lymph nodes).  Plaque-like film on the throat or tonsils.  This is often a symptom of bacterial pharyngitis.  Vomiting.  Stuffy nose (nasal congestion).  Cough.  Red, itchy eyes (conjunctivitis).  Loss of appetite. How is this diagnosed? This condition is often diagnosed based on your medical history and a physical exam. Your health care provider will ask you questions about your illness and your symptoms. A swab of  your throat may be done to check for bacteria (rapid strep test). Other lab tests may also be done, depending on the suspected cause, but these are rare. How is this treated? This condition usually gets better in 3-4 days without medicine. Bacterial pharyngitis may be treated with antibiotic medicines. Follow these instructions at home:  Take over-the-counter and prescription medicines only as told by your health care provider. ? If you were prescribed an antibiotic medicine, take it as told by your health care provider. Do not stop taking the antibiotic even if you start to feel better. ? Do not give children aspirin because of the association with Reye syndrome.  Drink enough water and fluids to keep your urine clear or pale yellow.  Get a lot of rest.  Gargle with a salt-water mixture 3-4 times a day or as needed. To make a salt-water mixture, completely dissolve -1 tsp of salt in 1 cup of warm water.  If your health care provider approves, you may use throat lozenges or sprays to soothe your throat. Contact a health care provider if:  You have large, tender lumps in your neck.  You have a rash.  You cough up green, yellow-brown, or bloody spit. Get help right away if:  Your neck becomes stiff.  You drool or are unable to swallow liquids.  You cannot drink or take medicines without vomiting.  You have severe pain that does not go away, even after you take medicine.  You have trouble breathing, and it is not caused by a stuffy nose.  You have new pain and swelling in your joints such as the knees, ankles, wrists,  or elbows. Summary  Pharyngitis is redness, pain, and swelling (inflammation) of the throat (pharynx).  While pharyngitis can be caused by a bacteria, the most common causes are viral.  Most cases of pharyngitis get better on their own without treatment.  Bacterial pharyngitis is treated with antibiotic medicines. This information is not intended to replace advice given to you by your health care provider. Make sure you discuss any questions you have with your health care provider. Document Revised: 04/09/2017 Document Reviewed: 06/02/2016 Elsevier Patient Education  2020 [employer].

## 2020-03-29 NOTE — Progress Notes (Signed)
Subjective:    Patient ID: Shelby Terry, female    DOB: [birth year], 29 y.o.   MRN: [id number]  29y/o caucasian female woke up with sore throat and noting some cough not very productive.  Home spo2 100% RA  Was told to have tonsils removed a couple years ago due to recurrent tonsillitis but she was concerned of possible complications and did not pursue surgery.  Typically gets sick at the end of the fall semester every year.  Denied fall allergies.  Denied known covid contacts fully covid vaccinated.  This doesn't feel like her usual strep.  Denied sandpapery bumps in mouth/palate.  Hasn't seen any white spots on tonsils.  Denied recent travel.  Preferred number [phone]  Patient in parking lot for drive up covid testing with RN Eddie Candle spoke with patient briefly and observed gait/covid testing/HPI obtained the rest of visit conducted via telephone.  Patient consented to telephone visit.  This visit was conducted via telephone/audio only after covid nasal swab performed/HPI obtained.  I spent 10 minutes on the telephone with patient.       Review of Systems  Constitutional: Negative for activity change, appetite change, chills, diaphoresis and fever.  HENT: Negative for congestion, dental problem, ear discharge, ear pain, facial swelling, mouth sores, postnasal drip, rhinorrhea, sinus pressure, sinus pain, sore throat, trouble swallowing and voice change.   Eyes: Negative for photophobia and visual disturbance.  Respiratory: Positive for cough. Negative for choking, chest tightness, shortness of breath, wheezing and stridor.   Cardiovascular: Negative for chest pain.  Gastrointestinal: Negative for diarrhea, nausea and vomiting.  Endocrine: Negative for cold intolerance and heat intolerance.  Genitourinary: Negative for difficulty urinating.  Musculoskeletal: Negative for myalgias, neck pain and neck stiffness.  Skin: Negative for rash.  Allergic/Immunologic: Positive for environmental  allergies. Negative for food allergies.  Neurological: Negative for tremors, seizures, syncope, speech difficulty, weakness, light-headedness, numbness and headaches.  Hematological: Negative for adenopathy. Does not bruise/bleed easily.  Psychiatric/Behavioral: Negative for agitation, confusion and sleep disturbance.       Objective:   Physical Exam Vitals and nursing note reviewed.  Constitutional:      General: She is not in acute distress.    Appearance: Normal appearance. She is not ill-appearing, toxic-appearing or diaphoretic.  HENT:     Head: Normocephalic and atraumatic.     Right Ear: External ear normal.     Left Ear: External ear normal.     Nose: Nose normal. No congestion or rhinorrhea.     Mouth/Throat:     Mouth: Mucous membranes are moist.     Pharynx: Posterior oropharyngeal erythema present. No oropharyngeal exudate.  Eyes:     General: No scleral icterus.       Right eye: No discharge.        Left eye: No discharge.     Extraocular Movements: Extraocular movements intact.     Conjunctiva/sclera: Conjunctivae normal.     Pupils: Pupils are equal, round, and reactive to light.  Pulmonary:     Effort: Pulmonary effort is normal. No respiratory distress.     Breath sounds: Normal breath sounds. No stridor. No wheezing.  Abdominal:     General: Abdomen is flat.  Musculoskeletal:        General: No swelling, deformity or signs of injury. Normal range of motion.  Skin:    General: Skin is warm and dry.     Coloration: Skin is not jaundiced or pale.     Findings:  No bruising, erythema, lesion or rash.  Neurological:     General: No focal deficit present.     Mental Status: She is alert and oriented to person, place, and time. Mental status is at baseline.     Cranial Nerves: No cranial nerve deficit.     Motor: No weakness.     Coordination: Coordination normal.     Gait: Gait normal.  Psychiatric:        Mood and Affect: Mood normal.        Behavior:  Behavior normal.        Thought Content: Thought content normal.        Judgment: Judgment normal.    Telephone message left for patient to return call to clinic to discuss plan of care further at 1032.  Message stated covid test negative.  Telephone notification Rapid covid test binaxnow negative today at 1112.  Discussed if new or worsening symptoms e.g. loss of taste/smell, n/v/d, fever, chills, sinusitis that repeat covid testing indicated on Monday 04/01/2020.  Discussed today was not optimal testing timing as day 1 of symptoms.  Preferred to test days 3-5 of covid symptoms for accurate test results.  exitcare handout covid testing.  Patient verbalized understanding information/instructions, agreed with plan of care and had no further questions at this time.     Assessment & Plan:  A-acute pharyngitis  P-History of sore throat/burning and seen ED diagnosed with GERD started on omeprazole 2018 and patient reported that it did resolve her symptoms but then she stopped medication.  Trial 14 day omeprazole 20mg  DR po daily with meal #90 RF0 may take daily if when stops symptoms restart.  Follow up with PCM prn if chronic heartburn noted.  Discussed if taking daily long term recommended magnesium level with annual labs as medication can lower magnesium serum levels.   Anti-reflux measures such as raising the head of the bed, avoiding tight clothing or belts, avoiding eating late at night and not lying down shortly after mealtime can be helpful. Avoid Aspirin, NSAID's, caffeine, peppermints, alcohol and tobacco. OTC H2 blockers and/or antacids are often very helpful for PRN use.  However, for chronic or daily symptoms, prescription strength H2 blockers or a trial of PPI's should be used.  Patient should alert me if there are persistent symptoms, dysphagia, weight loss or GI bleeding (bright red or black). Follow up with clinic provider or PCM if symptoms persist despite therapy. Exitcare handout  GERD/pharyngitis. Patient does not feel this is her regular tonsilitis/strep throat symptoms and therefore azithromycin rx held at this time.  Patient is allergic to penicillin.  Patient verbalized understanding information/instructions, agreed with plan of care and had no further questions at this time.  Discussed post nasal drip could be from cold weather rhinitis, virus, allergic rhinitis/nonallergic rhinitis.  Patient has flonase and nasal saline at home but currently not using discussed to restart use.  Discussed use nasal saline first wait 10-15 minutes then blow nose and lightly inhale 1 spray flonase each nostril twice a day.  Patient may use normal saline nasal spray 2 sprays each nostril q2h wa as needed. flonase 02-11-1976 1 spray each nostril BID #1 RF0.  Patient denied personal or family history of ENT cancer.  OTC antihistamine of choice claritin/zyrtec 10mg  po daily.  Avoid triggers if possible.  Shower prior to bedtime if exposed to triggers.  If allergic dust/dust mites recommend mattress/pillow covers/encasements; washing linens, vacuuming, sweeping, dusting weekly.  Call or return to clinic as  needed if these symptoms worsen or fail to improve as anticipated.   Exitcare handout on nonallergic/allergic rhinitis and sinus rinse.  Patient verbalized understanding of instructions, agreed with plan of care and had no further questions at this time.  P2:  Avoidance and hand washing.  Usually no specific medical treatment is needed if a virus is causing the sore throat. The throat most often gets better on its own within 5 to 7 days. Antibiotic medicine does not cure viral pharyngitis.  -For acute pharyngitis caused by bacteria, your healthcare provider will prescribe an antibiotic.  -Tylenol 1000mg  by mouth every 6 hours as needed for pain or fever OTC ER/call 911 if drooling, unable to swallow, trouble breathing discussed tonsillar abscess symptoms/hot potato voice. -Do not smoke.  -Avoid  secondhand smoke and other air pollutants.  -Use a cool mist humidifier to add moisture to the air.  -Get plenty of rest; sleep 7-8 hours per night -You may want to rest your throat by talking less and eating a diet that is mostly liquid or soft for a day or two.  -Nonprescription throat lozenges and mouthwashes should help relieve the soreness.  -Gargling with warm saltwater and drinking warm liquids may help. (You can make a saltwater solution by adding 1/4 teaspoon of salt to 8 ounces, or 240 mL, of warm water.)  -Change your toothbrush on your last day of antibiotic -Avoid oral intimate contact until antibiotics finished -Wash dishes/silverware/glasses in hot water or diswasher -Do not share glasses/silverware/dishes during meals that touch your lips/mouth -Usually no specific medical treatment is needed if a virus is causing the sore throat. The throat most often gets better on its own within 5 to 7 days. Antibiotic medicine does not cure viral pharyngitis.  DDx mononucleosis, strep throat, hand foot and mouth, post nasal drip irritation pharynx/throat -Exitcare handout on pharyngitis acute FOLLOW UP with clinic provider if no improvements in the next 7-10 days. Patient verbalized understanding of instructions and agreed with plan of care. Denied further questions at this time. P2: Hand washing and diet.

## 2020-04-05 DIAGNOSIS — F4312 Post-traumatic stress disorder, chronic: Secondary | ICD-10-CM | POA: Diagnosis not present

## 2020-04-05 DIAGNOSIS — F411 Generalized anxiety disorder: Secondary | ICD-10-CM | POA: Diagnosis not present

## 2020-04-08 ENCOUNTER — Telehealth: Payer: Self-pay

## 2020-04-08 NOTE — Telephone Encounter (Signed)
Spoke with pt by phone and scheduled her for a virtual visit tomorrow at 4pm.

## 2020-04-08 NOTE — Telephone Encounter (Signed)
Patient has appointment with Dr French Ana

## 2020-04-08 NOTE — Telephone Encounter (Signed)
I'm not sure why you sent this to me.  She needs an appointment

## 2020-04-08 NOTE — Telephone Encounter (Signed)
FYI Patient sent Mychart to PCP as well.           [facility] Night - Cl TELEPHONE ADVICE RECORD [facility] Patient Name: Shelby Terry Gender: Female DOB: [birth year] Age: 29 Y 2 M 23 D Return Phone Number: [phone] (Primary), [phone] (Secondary) Address: City/State/Zip: [place] [address] Client [facility] Night - Cl Client Site  Primary [facility]  Station - Night Contact Type Call Who Is Calling Patient / Member / Family / Caregiver Call Type Triage / Clinical Relationship To Patient Self Return Phone Number [phone] (Primary) Chief Complaint Cough Reason for Call Symptomatic / Request for Health Information Initial Comment Caller states she is concerned she has bronchitis and has a cough. Caller is requesting medication. Translation No Nurse Assessment Nurse: Karena Addison, RN, Verlon Au Date/Time (Eastern Time): 04/04/2020 10:27:31 AM Confirm and document reason for call. If symptomatic, describe symptoms. ---Caller states she is concerned she has bronchitis. Cough has been worse since last night. She got the Covid booster Monday and had a temp Monday night. Temp is currently 99.8. She has been taking Tylenol for temp. Does the patient have any new or worsening symptoms? ---Yes Will a triage be completed? ---Yes Related visit to physician within the last 2 weeks? ---No Does the PT have any chronic conditions? (i.e. diabetes, asthma, this includes High risk factors for pregnancy, etc.) ---No Is the patient pregnant or possibly pregnant? (Ask all females between the ages of 68-55) ---No Is this a behavioral health or substance abuse call? ---No Guidelines Guideline Title Affirmed Question Affirmed Notes Nurse Date/Time (Eastern Time) Cough - Acute NonProductive [1] Continuous (nonstop) coughing interferes with work or school AND [2] no improvement using cough treatment per  Care Advice Karena Addison, RN, Verlon Au 04/04/2020 10:29:31 AM Disp. Time Lamount Cohen Time) Disposition Final User 04/04/2020 10:32:55 AM See PCP within 24 Hours Yes Huffine, RN, Verlon Au PLEASE NOTE: All timestamps contained within this report are represented as [place] Standard Time. CONFIDENTIALTY NOTICE: This fax transmission is intended only for the addressee. It contains information that is legally privileged, confidential or otherwise protected from use or disclosure. If you are not the intended recipient, you are strictly prohibited from reviewing, disclosing, copying using or disseminating any of this information or taking any action in reliance on or regarding this information. If you have received this fax in error, please notify us immediately by telephone so that we can arrange for its return to [place]. Phone: [phone], Toll-Free: [phone], Fax: [phone] Page: 2 of 2 Call Id: [id number] Caller Disagree/Comply Comply Caller Understands Yes PreDisposition Call Doctor Care Advice Given Per Guideline SEE PCP WITHIN 24 HOURS: * IF OFFICE WILL BE OPEN: You need to be examined within the next 24 hours. Call your doctor (or NP/PA) when the office opens and make an appointment. COUGH MEDICINES: * COUGH DROPS: Over-the-counter cough drops can help a lot, especially for mild coughs. They soothe an irritated throat and remove the tickle sensation in the back of the throat. Cough drops are easy to carry with you. * COUGH SYRUP WITH DEXTROMETHORPHAN: An over-the-counter cough syrup can help your cough. The most common cough suppressant in over-the-counter cough medicines is dextromethorphan. HUMIDIFIER: * If the air is dry, use a humidifier in the bedroom. COUGHING SPELLS: * Drink warm fluids. Inhale warm mist. This can help relax the airway and also loosen up phlegm. * Suck on cough drops or hard candy to coat the irritated throat. CALL BACK IF: * Difficulty breathing  occurs * You become  worse Referrals GO TO FACILITY UNDECIDED

## 2020-04-10 ENCOUNTER — Other Ambulatory Visit: Payer: Self-pay

## 2020-04-10 ENCOUNTER — Telehealth: Payer: BC Managed Care – PPO | Admitting: Registered Nurse

## 2020-04-10 ENCOUNTER — Encounter: Payer: Self-pay | Admitting: Registered Nurse

## 2020-04-10 DIAGNOSIS — J00 Acute nasopharyngitis [common cold]: Secondary | ICD-10-CM

## 2020-04-10 DIAGNOSIS — J209 Acute bronchitis, unspecified: Secondary | ICD-10-CM

## 2020-04-10 MED ORDER — PREDNISONE 10 MG (21) PO TBPK: ORAL_TABLET | ORAL | 0 refills | Status: DC

## 2020-04-10 MED ORDER — PHENYLEPHRINE HCL 5 MG PO TABS
5.0000 mg | ORAL_TABLET | Freq: Four times a day (QID) | ORAL | 0 refills | Status: AC | PRN
Start: 2020-04-10 — End: 2020-04-17

## 2020-04-10 MED ORDER — AZITHROMYCIN 250 MG PO TABS: ORAL_TABLET | ORAL | 0 refills | Status: DC

## 2020-04-10 MED ORDER — BENZONATATE 200 MG PO CAPS: 200.0000 mg | ORAL_CAPSULE | Freq: Three times a day (TID) | ORAL | 0 refills | Status: AC | PRN

## 2020-04-10 NOTE — Patient Instructions (Signed)
Acute Bronchitis, Adult  Acute bronchitis is sudden or acute swelling of the air tubes (bronchi) in the lungs. Acute bronchitis causes these tubes to fill with mucus, which can make it hard to breathe. It can also cause coughing or wheezing. In adults, acute bronchitis usually goes away within 2 weeks. A cough caused by bronchitis may last up to 3 weeks. Smoking, allergies, and asthma can make the condition worse. What are the causes? This condition can be caused by germs and by substances that irritate the lungs, including:  Cold and flu viruses. The most common cause of this condition is the virus that causes the common cold.  Bacteria.  Substances that irritate the lungs, including: ? Smoke from cigarettes and other forms of tobacco. ? Dust and pollen. ? Fumes from chemical products, gases, or burned fuel. ? Other materials that pollute indoor or outdoor air.  Close contact with someone who has acute bronchitis. What increases the risk? The following factors may make you more likely to develop this condition:  A weak body's defense system, also called the immune system.  A condition that affects your lungs and breathing, such as asthma. What are the signs or symptoms? Common symptoms of this condition include:  Lung and breathing problems, such as: ? Coughing. This may bring up clear, yellow, or green mucus from your lungs (sputum). ? Wheezing. ? Having too much mucus in your lungs (chest congestion). ? Having shortness of breath.  A fever.  Chills.  Aches and pains, including: ? Tightness in your chest and other body aches. ? A sore throat. How is this diagnosed? This condition is usually diagnosed based on:  Your symptoms and medical history.  A physical exam. You may also have other tests, including tests to rule out other conditions, such pneumonia. These tests include:  A test of lung function.  Test of a mucus sample to look for the presence of  bacteria.  Tests to check the oxygen level in your blood.  Blood tests.  Chest X-ray. How is this treated? Most cases of acute bronchitis clear up over time without treatment. Your health care provider may recommend:  Drinking more fluids. This can thin your mucus, which may improve your breathing.  Taking a medicine for a fever or cough.  Using a device that gets medicine into your lungs (inhaler) to help improve breathing and control coughing.  Using a vaporizer or a humidifier. These are machines that add water to the air to help you breathe better. Follow these instructions at home: Activity  Get plenty of rest.  Return to your normal activities as told by your health care provider. Ask your health care provider what activities are safe for you. Lifestyle  Drink enough fluid to keep your urine pale yellow.  Do not drink alcohol.  Do not use any products that contain nicotine or tobacco, such as cigarettes, e-cigarettes, and chewing tobacco. If you need help quitting, ask your health care provider. Be aware that: ? Your bronchitis will get worse if you smoke or breathe in other people's smoke (secondhand smoke). ? Your lungs will heal faster if you quit smoking. General instructions   Take over-the-counter and prescription medicines only as told by your health care provider.  Use an inhaler, vaporizer, or humidifier as told by your health care provider.  If you have a sore throat, gargle with a salt-water mixture 3-4 times a day or as needed. To make a salt-water mixture, completely dissolve -1 tsp (3-6  g) of salt in 1 cup (237 mL) of warm water.  Keep all follow-up visits as told by your health care provider. This is important. How is this prevented? To lower your risk of getting this condition again:  Wash your hands often with soap and water. If soap and water are not available, use hand sanitizer.  Avoid contact with people who have cold symptoms.  Try not to  touch your mouth, nose, or eyes with your hands.  Avoid places where there are fumes from chemicals. Breathing these fumes will make your condition worse.  Get the flu shot every year. Contact a health care provider if:  Your symptoms do not improve after 2 weeks of treatment.  You vomit more than once or twice.  You have symptoms of dehydration such as: ? Dark urine. ? Dry skin or eyes. ? Increased thirst. ? Headaches. ? Confusion. ? Muscle cramps. Get help right away if you:  Cough up blood.  Feel pain in your chest.  Have severe shortness of breath.  Faint or keep feeling like you are going to faint.  Have a severe headache.  Have fever or chills that get worse. These symptoms may represent a serious problem that is an emergency. Do not wait to see if the symptoms will go away. Get medical help right away. Call your local emergency services (911 in the [place] Do not drive yourself to the hospital. Summary  Acute bronchitis is sudden (acute) inflammation of the air tubes (bronchi) between the windpipe and the lungs. In adults, acute bronchitis usually goes away within 2 weeks, although coughing may last 3 weeks or longer  Take over-the-counter and prescription medicines only as told by your health care provider.  Drink enough fluid to keep your urine pale yellow.  Contact a health care provider if your symptoms do not improve after 2 weeks of treatment.  Get help right away if you cough up blood, faint, or have chest pain or shortness of breath. This information is not intended to replace advice given to you by your health care provider. Make sure you discuss any questions you have with your health care provider. Document Revised: 01/09/2019 Document Reviewed: 11/18/2018 Elsevier Patient Education  2020 [employer] Cough, Adult Coughing is a reflex that clears your throat and your airways (respiratory system). Coughing helps to heal and protect your lungs. It is  normal to cough occasionally, but a cough that happens with other symptoms or lasts a long time may be a sign of a condition that needs treatment. An acute cough may only last 2-3 weeks, while a chronic cough may last 8 or more weeks. Coughing is commonly caused by:  Infection of the respiratory systemby viruses or bacteria.  Breathing in substances that irritate your lungs.  Allergies.  Asthma.  Mucus that runs down the back of your throat (postnasal drip).  Smoking.  Acid backing up from the stomach into the esophagus (gastroesophageal reflux).  Certain medicines.  Chronic lung problems.  Other medical conditions such as heart failure or a blood clot in the lung (pulmonary embolism). Follow these instructions at home: Medicines  Take over-the-counter and prescription medicines only as told by your health care provider.  Talk with your health care provider before you take a cough suppressant medicine. Lifestyle   Avoid cigarette smoke. Do not use any products that contain nicotine or tobacco, such as cigarettes, e-cigarettes, and chewing tobacco. If you need help quitting, ask your health care provider.  Drink  enough fluid to keep your urine pale yellow.  Avoid caffeine.  Do not drink alcohol if your health care provider tells you not to drink. General instructions   Pay close attention to changes in your cough. Tell your health care provider about them.  Always cover your mouth when you cough.  Avoid things that make you cough, such as perfume, candles, cleaning products, or campfire or tobacco smoke.  If the air is dry, use a cool mist vaporizer or humidifier in your bedroom or your home to help loosen secretions.  If your cough is worse at night, try to sleep in a semi-upright position.  Rest as needed.  Keep all follow-up visits as told by your health care provider. This is important. Contact a health care provider if you:  Have new symptoms.  Cough up  pus.  Have a cough that does not get better after 2-3 weeks or gets worse.  Cannot control your cough with cough suppressant medicines and you are losing sleep.  Have pain that gets worse or pain that is not helped with medicine.  Have a fever.  Have unexplained weight loss.  Have night sweats. Get help right away if:  You cough up blood.  You have difficulty breathing.  Your heartbeat is very fast. These symptoms may represent a serious problem that is an emergency. Do not wait to see if the symptoms will go away. Get medical help right away. Call your local emergency services (911 in the [place] Do not drive yourself to the hospital. Summary  Coughing is a reflex that clears your throat and your airways. It is normal to cough occasionally, but a cough that happens with other symptoms or lasts a long time may be a sign of a condition that needs treatment.  Take over-the-counter and prescription medicines only as told by your health care provider.  Always cover your mouth when you cough.  Contact a health care provider if you have new symptoms or a cough that does not get better after 2-3 weeks or gets worse. This information is not intended to replace advice given to you by your health care provider. Make sure you discuss any questions you have with your health care provider. Document Revised: 05/16/2018 Document Reviewed: 05/16/2018 Elsevier Patient Education  2020 [employer]. How to Perform a Sinus Rinse A sinus rinse is a home treatment that is used to rinse your sinuses with a sterile mixture of salt and water (saline solution). Sinuses are air-filled spaces in your skull behind the bones of your face and forehead that open into your nasal cavity. A sinus rinse can help to clear mucus, dirt, dust, or pollen from your nasal cavity. You may do a sinus rinse when you have a cold, a virus, nasal allergy symptoms, a sinus infection, or stuffiness in your nose or sinuses. Talk  with your health care provider about whether a sinus rinse might help you. What are the risks? A sinus rinse is generally safe and effective. However, there are a few risks, which include:  A burning sensation in your sinuses. This may happen if you do not make the saline solution as directed. Be sure to follow all directions when making the saline solution.  Nasal irritation.  Infection from contaminated water. This is rare, but possible. Do not do a sinus rinse if you have had ear or nasal surgery, ear infection, or blocked ears. Supplies needed:  Saline solution or powder.  Distilled or sterile water may be  needed to mix with saline powder. ? You may use boiled and cooled tap water. Boil tap water for 5 minutes; cool until it is lukewarm. Use within 24 hours. ? Do not use regular tap water to mix with the saline solution.  Neti pot or nasal rinse bottle. These supplies release the saline solution into your nose and through your sinuses. Neti pots and nasal rinse bottles can be purchased at [facility] local [facility], a health food store, or online. How to perform a sinus rinse  1. Wash your hands with soap and water. 2. Wash your device according to the directions that came with the product and then dry it. 3. Use the solution that comes with your product or one that is sold separately in stores. Follow the mixing directions on the package if you need to mix with sterile or distilled water. 4. Fill the device with the amount of saline solution noted in the device instructions. 5. Stand over a sink and tilt your head sideways over the sink. 6. Place the spout of the device in your upper nostril (the one closer to the ceiling). 7. Gently pour or squeeze the saline solution into your nasal cavity. The liquid should drain out from the lower nostril if you are not too congested. 8. While rinsing, breathe through your open mouth. 9. Gently blow your nose to clear any mucus and rinse solution.  Blowing too hard may cause ear pain. 10. Repeat in your other nostril. 11. Clean and rinse your device with clean water and then air-dry it. Talk with your health care provider or pharmacist if you have questions about how to do a sinus rinse. Summary  A sinus rinse is a home treatment that is used to rinse your sinuses with a sterile mixture of salt and water (saline solution).  A sinus rinse is generally safe and effective. Follow all instructions carefully.  Before doing a sinus rinse, talk with your health care provider about whether it would be helpful for you. This information is not intended to replace advice given to you by your health care provider. Make sure you discuss any questions you have with your health care provider. Document Revised: 02/22/2017 Document Reviewed: 02/22/2017 Elsevier Patient Education  2020 [employer]. Nonallergic Rhinitis Nonallergic rhinitis is a condition that causes symptoms that affect the nose, such as a runny nose and a stuffed-up nose (nasal congestion) that can make it hard to breathe through the nose. This condition is different from having an allergy (allergic rhinitis). Allergic rhinitis occurs when the body's defense system (immune system) reacts to a substance that you are allergic to (allergen), such as pollen, pet dander, mold, or dust. Nonallergic rhinitis has many similar symptoms, but it is not caused by allergens. Nonallergic rhinitis can be a short-term or long-term problem. What are the causes? This condition can be caused by many different things. Some common types of nonallergic rhinitis include: Infectious rhinitis  This is usually due to an infection in the upper respiratory tract. Vasomotor rhinitis  This is the most common type of long-term nonallergic rhinitis.  It is caused by too much blood flow through the nose, which makes the tissue inside of the nose swell.  Symptoms are often triggered by strong odors, cold air,  stress, drinking alcohol, cigarette smoke, or changes in the weather. Occupational rhinitis  This type is caused by triggers in the workplace, such as chemicals, dusts, animal dander, or air pollution. Hormonal rhinitis  This type occurs in  women as a result of an increase in the female hormone estrogen.  It may occur during pregnancy, puberty, and menstrual cycles.  Symptoms improve when estrogen levels drop. Drug-induced rhinitis Several drugs can cause nonallergic rhinitis, including:  Medicines that are used to treat high blood pressure, heart disease, and Parkinson disease.  Aspirin and NSAIDs.  Over-the-counter nasal decongestant sprays. These can cause a type of nonallergic rhinitis (rhinitis medicamentosa) when they are used for more than a few days. Nonallergic rhinitis with eosinophilia syndrome (NARES)  This type is caused by having too much of a certain type of white blood cell (eosinophil). Nonallergic rhinitis can also be caused by a reaction to eating hot or spicy foods. This does not usually cause long-term symptoms. In some cases, the cause of nonallergic rhinitis is not known. What increases the risk? You are more likely to develop this condition if:  You are 93-56 years of age.  You are a woman. Women are twice as likely to have this condition. What are the signs or symptoms? Common symptoms of this condition include:  Nasal congestion.  Runny nose.  The feeling of mucus going down the back of the throat (postnasal drip).  Trouble sleeping at night and daytime sleepiness. Less common symptoms include:  Sneezing.  Coughing.  Itchy nose.  Bloodshot eyes. How is this diagnosed? This condition may be diagnosed based on:  Your symptoms and medical history.  A physical exam.  Allergy testing to rule out allergic rhinitis. You may have skin tests or blood tests. In some cases, the health care provider may take a swab of nasal secretions to look for  an increased number of eosinophils. This would be done to confirm a diagnosis of NARES. How is this treated? Treatment for this condition depends on the cause. No single treatment works for everyone. Work with your health care provider to find the best treatment for you. Treatment may include:  Avoiding the things that trigger your symptoms.  Using medicines to relieve congestion, such as: ? Steroid nasal spray. There are many types. You may need to try a few to find out which one works best. ? Decongestant medicine. This may be an oral medicine or a nasal spray. These medicines are only used for a short time.  Using medicines to relieve a runny nose. These may include antihistamine medicines or anticholinergic nasal sprays.  Surgery to remove tissue from inside the nose may be needed in severe cases if the condition has not improved after 6-12 months of medical treatment. Follow these instructions at home:  Take or use over-the-counter and prescription medicines only as told by your health care provider. Do not stop using your medicine even if you start to feel better.  Use salt-water (saline) rinses or other solutions (nasal washes or irrigations) to wash or rinse out the inside of your nose as told by your health care provider.  Do not take NSAIDs or medicines that contain aspirin if they make your symptoms worse.  Do not drink alcohol if it makes your symptoms worse.  Do not use any tobacco products, such as cigarettes, chewing tobacco, and e-cigarettes. If you need help quitting, ask your health care provider.  Avoid secondhand smoke.  Get some exercise every day. Exercise may help reduce symptoms of nonallergic rhinitis for some people. Ask your health care provider how much exercise and what types of exercise are safe for you.  Sleep with the head of your bed raised (elevated). This may reduce  nighttime nasal congestion.  Keep all follow-up visits as told by your health care  provider. This is important. Contact a health care provider if:  You have a fever.  Your symptoms are getting worse at home.  Your symptoms are not responding to medicine.  You develop new symptoms, especially a headache or nosebleed. This information is not intended to replace advice given to you by your health care provider. Make sure you discuss any questions you have with your health care provider. Document Revised: 04/09/2017 Document Reviewed: 07/18/2015 Elsevier Patient Education  2020 [employer]. Allergic Rhinitis, Adult Allergic rhinitis is an allergic reaction that affects the mucous membrane inside the nose. It causes sneezing, a runny or stuffy nose, and the feeling of mucus going down the back of the throat (postnasal drip). Allergic rhinitis can be mild to severe. There are two types of allergic rhinitis:  Seasonal. This type is also called hay fever. It happens only during certain seasons.  Perennial. This type can happen at any time of the year. What are the causes? This condition happens when the body's defense system (immune system) responds to certain harmless substances called allergens as though they were germs.  Seasonal allergic rhinitis is triggered by pollen, which can come from grasses, trees, and weeds. Perennial allergic rhinitis may be caused by:  House dust mites.  Pet dander.  Mold spores. What are the signs or symptoms? Symptoms of this condition include:  Sneezing.  Runny or stuffy nose (nasal congestion).  Postnasal drip.  Itchy nose.  Tearing of the eyes.  Trouble sleeping.  Daytime sleepiness. How is this diagnosed? This condition may be diagnosed based on:  Your medical history.  A physical exam.  Tests to check for related conditions, such as: ? Asthma. ? Pink eye. ? Ear infection. ? Upper respiratory infection.  Tests to find out which allergens trigger your symptoms. These may include skin or blood tests. How is  this treated? There is no cure for this condition, but treatment can help control symptoms. Treatment may include:  Taking medicines that block allergy symptoms, such as antihistamines. Medicine may be given as a shot, nasal spray, or pill.  Avoiding the allergen.  Desensitization. This treatment involves getting ongoing shots until your body becomes less sensitive to the allergen. This treatment may be done if other treatments do not help.  If taking medicine and avoiding the allergen does not work, new, stronger medicines may be prescribed. Follow these instructions at home:  Find out what you are allergic to. Common allergens include smoke, dust, and pollen.  Avoid the things you are allergic to. These are some things you can do to help avoid allergens: ? Replace carpet with wood, tile, or vinyl flooring. Carpet can trap dander and dust. ? Do not smoke. Do not allow smoking in your home. ? Change your heating and air conditioning filter at least once a month. ? During allergy season:  Keep windows closed as much as possible.  Plan outdoor activities when pollen counts are lowest. This is usually during the evening hours.  When coming indoors, change clothing and shower before sitting on furniture or bedding.  Take over-the-counter and prescription medicines only as told by your health care provider.  Keep all follow-up visits as told by your health care provider. This is important. Contact a health care provider if:  You have a fever.  You develop a persistent cough.  You make whistling sounds when you breathe (you wheeze).  Your  symptoms interfere with your normal daily activities. Get help right away if:  You have shortness of breath. Summary  This condition can be managed by taking medicines as directed and avoiding allergens.  Contact your health care provider if you develop a persistent cough or fever.  During allergy season, keep windows closed as much as  possible. This information is not intended to replace advice given to you by your health care provider. Make sure you discuss any questions you have with your health care provider. Document Revised: 04/09/2017 Document Reviewed: 06/04/2016 Elsevier Patient Education  2020 [employer].

## 2020-04-10 NOTE — Progress Notes (Signed)
Subjective:    Patient ID: Shelby Terry, female    DOB: 12-12-1990, 29 y.o.   MRN: [id number]  29y/o caucasian female here for re-evaluation as sore throat resolved but 3 days after starting omeprazole cough started; worsened on Thanksgiving 25 Nov and lingering now.  Low grade fever/fatigue.  Mucous clear not cloudy.  Tessalon pearls have helped in the past.  Oral prednisone only when really sick and not for a couple of years.  Has never required inhaler.  Using nasal saline at this time but not flonase.  Robitussin cough medicine at night helping.  Initial telehealth visit 19 Nov  She had covid booster administered 22 Nov runny nose 48 hours afterwards started feeling better Wednesday afternoon 24 Nov but then no sleep that night due to cough  Friday coughed so hard threw up but no episodes since that time.  Has not used promethazine liquid for cough before.  Appetite decreased.  Flu vaccine administered Sep 2021  Denied known covid/sick contacts  Patient consented to telephone visit.  This visit was conducted entirely via telephone/audio only.  I spent 11 minutes on the telephone with patient.       Review of Systems  Constitutional: Positive for fatigue and fever. Negative for activity change, appetite change, chills and diaphoresis.  HENT: Positive for congestion, postnasal drip, rhinorrhea and sore throat. Negative for dental problem, drooling, ear discharge, ear pain, facial swelling, hearing loss, mouth sores, nosebleeds, sinus pressure, sinus pain, sneezing, tinnitus, trouble swallowing and voice change.   Eyes: Negative for photophobia and visual disturbance.  Respiratory: Positive for cough. Negative for choking, chest tightness, shortness of breath, wheezing and stridor.   Cardiovascular: Negative for chest pain.  Gastrointestinal: Negative for diarrhea, nausea and vomiting.  Endocrine: Negative for cold intolerance and heat intolerance.  Genitourinary: Negative for difficulty  urinating.  Musculoskeletal: Negative for back pain, gait problem, myalgias, neck pain and neck stiffness.  Skin: Negative for rash.  Allergic/Immunologic: Positive for environmental allergies. Negative for food allergies.  Neurological: Positive for headaches. Negative for dizziness, tremors, seizures, syncope, facial asymmetry, speech difficulty, weakness, light-headedness and numbness.  Hematological: Negative for adenopathy. Does not bruise/bleed easily.  Psychiatric/Behavioral: Negative for agitation, confusion and sleep disturbance.       Objective:   Physical Exam Constitutional:      General: She is awake. She is not in acute distress. HENT:     Right Ear: Hearing normal.     Left Ear: Hearing normal.     Nose: Congestion present.     Mouth/Throat:     Pharynx: Oropharynx is clear.  Pulmonary:     Effort: No respiratory distress.     Breath sounds: No stridor or transmitted upper airway sounds. No wheezing.     Comments: Slight nasal congestion in voice; spoke full sentences without difficulty but frequent short dry cough during telephone conversation Neurological:     Mental Status: She is alert and oriented to person, place, and time. Mental status is at baseline.  Psychiatric:        Attention and Perception: Attention and perception normal.        Mood and Affect: Mood normal.        Behavior: Behavior normal. Behavior is cooperative.        Thought Content: Thought content normal.        Cognition and Memory: Cognition and memory normal.        Judgment: Judgment normal.  Assessment & Plan:  A-acute bronchitis, acute rhinitis  P-Stop mucinex if mucous thin/runny.  Mucinex used to liquify thick/tenacious sputum/mucous.  Discussed viral URI non covid non flu circulating that lasts 2-3 weeks in community.  Continue omeprazole 20mg  po daily.  Rx Azithromycin 250mg  sig take 2 day one by mouth day 1 then take 1 tab daily x 4 days #6 RF0 electronic Rx to her  pharmacy of choice.  Start if mucous becomes cloudy thick/fever/chills/sinus pain/pressure.  May continue robitussin DM po per manufacturer instructions.  Continue honey with lemon also.  Need to dry up post nasal drip see below. Cough lozenges po prn cough OTC.  Prednisone taper 10mg  (60/50/40/30/20/10mg ) po daily with breakfast #21 RF0 Electronic rx to her pharmacy of choice. Discussed possible side effects increased/decreased appetite, difficulty sleeping, increased blood sugar, increased blood pressure and heart rate.  Consider Rx Albuterol MDI 1-2 puffs po q4-6h prn protracted cough/wheezing/chest tightness patient to notify clinic staff if this occurs side effect increased heart rate.  If coughing leads to vomiting consider starting promethazine 6.25mg /39ml po QID prn cough/emesis rx.  Patient did not want at this time. Bronchitis simple, community acquired, may have started as viral (probably respiratory syncytial, parainfluenza, influenza, or adenovirus), but now evidence of acute purulent bronchitis with resultant bronchial edema and mucus formation.  Viruses are the most common cause of bronchial inflammation in otherwise healthy adults with acute bronchitis.  The appearance of sputum is not predictive of whether a bacterial infection is present.  Purulent sputum is most often caused by viral infections.  There are a small portion of those caused by non-viral agents being Mycoplama pneumonia.  Microscopic examination or C&S of sputum in the healthy adult with acute bronchitis is generally not helpful (usually negative or normal respiratory flora) other considerations being cough from upper respiratory tract infections, sinusitis or allergic syndromes (mild asthma or viral pneumonia).  Differential Diagnoses:  reactive airway disease (asthma, allergic aspergillosis (eosinophilia), chronic bronchitis, respiratory infection (sinusitis, common cold, pneumonia), congestive heart failure, reflux  esophagitis, bronchogenic tumor, aspiration syndromes and/or exposure to pulmonary irritants/smoke.  Without high fever, severe dyspnea, lack of physical findings or other risk factors, I will hold on a chest radiograph and CBC at this time.  I discussed that approximately 50% of patients with acute bronchitis have a cough that lasts up to three weeks, and 25% for over a month. Exitcare handout on bronchitis and cough.  ER if hemopthysis, SOB, worst chest pain of life.  If new or worsening symptoms will also consider covid testing.   Patient instructed to follow up if symptoms worsen and do not improve as expected with plan of care.  Patient verbalized agreement and understanding of treatment plan and had no further questions at this time.  P2:  hand washing and cover cough  Discussed cold temperatures can cause runny nose.  Many people in community experiencing allergy flare due to blowing leaves/dry conditions/windy and temperature swings 20-70s.  Patient may use normal saline nasal spray 2 sprays each nostril q2h wa as needed. Restart flonase 03-30-1981 1 spray each nostril BID.  Patient denied personal or family history of ENT cancer.  OTC antihistamine of choice claritin/zyrtec 10mg  po daily.  Phenylephrine 5-10mg  po q6h helpful to dry up drip if flonase/saline not enough.  Alternative pseudoephedrine 30mg  po q4-6h prn rhinitis OTC but typically raises HR/BP more, drowsy or wired/insomnia common side effects.  Max dose 240mg  per day  Must show drivers license at pharmacy to  purchase.  Phenylephrine in cough/cold aisle and typically not as many side effects as pseudoephredrine.  Check active ingredient list on otc cough and cold preparations.    Avoid triggers if possible.  Shower prior to bedtime if exposed to triggers.  If allergic dust/dust mites recommend mattress/pillow covers/encasements; washing linens, vacuuming, sweeping, dusting weekly.  Call or return to clinic as needed if these symptoms worsen or fail  to improve as anticipated.   Exitcare handout on nonallergic rhinitis, allergic rhinitis and sinus rinse given to patient.  Patient verbalized understanding of instructions, agreed with plan of care and had no further questions at this time.  P2:  Avoidance and hand washing.

## 2020-04-11 ENCOUNTER — Telehealth: Payer: BC Managed Care – PPO | Admitting: Internal Medicine

## 2020-04-18 DIAGNOSIS — F411 Generalized anxiety disorder: Secondary | ICD-10-CM | POA: Diagnosis not present

## 2020-04-18 DIAGNOSIS — F4312 Post-traumatic stress disorder, chronic: Secondary | ICD-10-CM | POA: Diagnosis not present

## 2020-04-26 DIAGNOSIS — F4312 Post-traumatic stress disorder, chronic: Secondary | ICD-10-CM | POA: Diagnosis not present

## 2020-04-26 DIAGNOSIS — F411 Generalized anxiety disorder: Secondary | ICD-10-CM | POA: Diagnosis not present

## 2020-05-06 DIAGNOSIS — Z803 Family history of malignant neoplasm of breast: Secondary | ICD-10-CM | POA: Diagnosis not present

## 2020-05-06 DIAGNOSIS — R922 Inconclusive mammogram: Secondary | ICD-10-CM | POA: Diagnosis not present

## 2020-05-06 DIAGNOSIS — R928 Other abnormal and inconclusive findings on diagnostic imaging of breast: Secondary | ICD-10-CM | POA: Diagnosis not present

## 2020-05-09 DIAGNOSIS — F4312 Post-traumatic stress disorder, chronic: Secondary | ICD-10-CM | POA: Diagnosis not present

## 2020-05-09 DIAGNOSIS — F411 Generalized anxiety disorder: Secondary | ICD-10-CM | POA: Diagnosis not present

## 2020-05-13 ENCOUNTER — Encounter: Payer: Self-pay | Admitting: Internal Medicine

## 2020-05-13 ENCOUNTER — Telehealth (INDEPENDENT_AMBULATORY_CARE_PROVIDER_SITE_OTHER): Payer: BC Managed Care – PPO | Admitting: Internal Medicine

## 2020-05-13 DIAGNOSIS — J069 Acute upper respiratory infection, unspecified: Secondary | ICD-10-CM | POA: Diagnosis not present

## 2020-05-13 MED ORDER — FLOVENT HFA 110 MCG/ACT IN AERO: 1.0000 | INHALATION_SPRAY | Freq: Two times a day (BID) | RESPIRATORY_TRACT | 0 refills | Status: DC

## 2020-05-13 MED ORDER — BENZONATATE 100 MG PO CAPS: 100.0000 mg | ORAL_CAPSULE | Freq: Two times a day (BID) | ORAL | 0 refills | Status: DC | PRN

## 2020-05-13 MED ORDER — PREDNISONE 10 MG PO TABS: ORAL_TABLET | ORAL | 0 refills | Status: DC

## 2020-05-13 NOTE — Progress Notes (Signed)
Virtual Visit via Caregility  This visit type was conducted due to national recommendations for restrictions regarding the COVID-19 pandemic (e.g. social distancing).  This format is felt to be most appropriate for this patient at this time.  All issues noted in this document were discussed and addressed.  No physical exam was performed (except for noted visual exam findings with Video Visits).   I connected with@ on 05/13/20 at  4:30 PM EST by a video enabled telemedicine application and verified that I am speaking with the correct person using two identifiers. Location patient: home Location provider: work or home office Persons participating in the virtual visit: patient, provider  I discussed the limitations, risks, security and privacy concerns of performing an evaluation and management service by telephone and the availability of in person appointments. I also discussed with the patient that there may be a patient responsible charge related to this service. The patient expressed understanding and agreed to proceed.   Reason for visit: cough  HPI:  30 yr old female with no significant pulmonary history presents with persistent non productive cough.  Started with an acute bacterial illness in late Novmber that was treated by [facility] provider with   z pak and prednisone . Symptoms  Were dry cough , congestion, fever,  Diarrhea.  Home COVID test was negative   No PCR was done.  SYMPTOMS improved but did not completely resolved  Cough became bothersome again  2 weeks ago,  Initially dry,  But has been slightly productive cough for the past 4-5 days along with nonpurulent sinus drainage. TESTED COVID NEGATIVE BY  PCR LAST WEEK  .  No chest pain, no dyspnea.  No body aches. For the past 2 days has had 2 loose stools . 2 daily    ROS: See pertinent positives and negatives per HPI.  Past Medical History:  Diagnosis Date  . Allergy   . Anxiety   . Chicken pox 1994  . Depression    . Fibromyalgia   . Interstitial cystitis   . Irritable bowel syndrome y-8  . Migraines 2008    No past surgical history on file.  Family History  Problem Relation Age of Onset  . Hyperlipidemia Mother   . Diabetes Mother   . Hyperlipidemia Father   . Hypertension Father   . Diabetes Father   . Colon polyps Father   . Cervical cancer Maternal Grandmother   . Hypertension Maternal Grandfather   . Hyperlipidemia Maternal Grandfather   . Colon polyps Maternal Grandfather   . Diabetes Maternal Grandfather   . Breast cancer Paternal Grandmother 109  . Lung cancer Paternal Grandfather   . Emphysema Paternal Grandfather   . Prostate cancer Maternal Uncle   . Breast cancer Paternal Aunt 13  . Diabetes Maternal Uncle   . Liver disease Maternal Uncle        x 2  . Asthma Brother     SOCIAL HX:  reports that she has never smoked. She has never used smokeless tobacco. She reports current alcohol use. She reports that she does not use drugs.   Current Outpatient Medications:  .  acetaminophen (TYLENOL) 325 MG tablet, Take 325 mg every 4 (four) hours as needed by mouth., Disp: , Rfl:  .  benzonatate (TESSALON) 100 MG capsule, Take 1 capsule (100 mg total) by mouth 2 (two) times daily as needed for cough., Disp: 20 capsule, Rfl: 0 .  cetirizine (ZYRTEC) 10 MG tablet, Take 10 mg by  mouth daily., Disp: , Rfl:  .  fluticasone (FLOVENT HFA) 110 MCG/ACT inhaler, Inhale 1 puff into the lungs in the morning and at bedtime., Disp: 1 each, Rfl: 0 .  predniSONE (DELTASONE) 10 MG tablet, 6 tablets on Day 1 , then reduce by 1 tablet daily until gone, Disp: 21 tablet, Rfl: 0 .  triamcinolone cream (KENALOG) 0.1 %, Apply 1 application topically 2 (two) times daily., Disp: 30 g, Rfl: 2  EXAM:  VITALS per patient if applicable:  GENERAL: alert, oriented, appears well and in no acute distress  HEENT: atraumatic, conjunttiva clear, no obvious abnormalities on inspection of external nose and  ears  NECK: normal movements of the head and neck  LUNGS: on inspection no signs of respiratory distress, breathing rate appears normal, no obvious gross SOB, gasping or wheezing  CV: no obvious cyanosis  MS: moves all visible extremities without noticeable abnormality  PSYCH/NEURO: pleasant and cooperative, no obvious depression or anxiety, speech and thought processing grossly intact  ASSESSMENT AND PLAN:  Discussed the following assessment and plan:  Upper respiratory tract infection, unspecified type  Upper respiratory infection Symptoms currently appear to be non viral.  She was treated with macrolide 4 weeks ago for COVID negative URI and is currently COVOID negative by PCR.  Supportive care including cough suppressant,  prednisone, steroid MDI .  If sinus symptoms do not resolve after 1 week, will prescribe antibiotics    I discussed the assessment and treatment plan with the patient. The patient was provided an opportunity to ask questions and all were answered. The patient agreed with the plan and demonstrated an understanding of the instructions.   The patient was advised to call back or seek an in-person evaluation if the symptoms worsen or if the condition fails to improve as anticipated.  I provided  20 minutes of  face-to-face time during this encounter.   Sherlene Shams, MD

## 2020-05-14 NOTE — Assessment & Plan Note (Signed)
Symptoms currently appear to be non viral.  She was treated with macrolide 4 weeks ago for COVID negative URI and is currently COVOID negative by PCR.  Supportive care including cough suppressant,  prednisone, steroid MDI .  If sinus symptoms do not resolve after 1 week, will prescribe antibiotics

## 2020-05-17 DIAGNOSIS — K635 Polyp of colon: Secondary | ICD-10-CM | POA: Diagnosis not present

## 2020-05-17 DIAGNOSIS — Z803 Family history of malignant neoplasm of breast: Secondary | ICD-10-CM | POA: Diagnosis not present

## 2020-05-17 DIAGNOSIS — Z8042 Family history of malignant neoplasm of prostate: Secondary | ICD-10-CM | POA: Diagnosis not present

## 2020-05-20 ENCOUNTER — Telehealth: Payer: Self-pay | Admitting: *Deleted

## 2020-05-20 ENCOUNTER — Other Ambulatory Visit: Payer: Self-pay | Admitting: General Surgery

## 2020-05-20 DIAGNOSIS — R928 Other abnormal and inconclusive findings on diagnostic imaging of breast: Secondary | ICD-10-CM

## 2020-05-20 NOTE — Telephone Encounter (Signed)
RCVD ORDER FROM [facility] FOR [place] [facility] BX.  VM TO PT TO [facility].

## 2020-05-23 DIAGNOSIS — F4312 Post-traumatic stress disorder, chronic: Secondary | ICD-10-CM | POA: Diagnosis not present

## 2020-05-23 DIAGNOSIS — F411 Generalized anxiety disorder: Secondary | ICD-10-CM | POA: Diagnosis not present

## 2020-05-25 DIAGNOSIS — Z20822 Contact with and (suspected) exposure to covid-19: Secondary | ICD-10-CM | POA: Diagnosis not present

## 2020-05-29 DIAGNOSIS — F4312 Post-traumatic stress disorder, chronic: Secondary | ICD-10-CM | POA: Diagnosis not present

## 2020-05-29 DIAGNOSIS — F411 Generalized anxiety disorder: Secondary | ICD-10-CM | POA: Diagnosis not present

## 2020-06-04 ENCOUNTER — Ambulatory Visit
Admission: RE | Admit: 2020-06-04 | Discharge: 2020-06-04 | Disposition: A | Discharge status: Home / Self Care | Payer: BC Managed Care – PPO | Source: Ambulatory Visit | Attending: General Surgery | Admitting: General Surgery

## 2020-06-04 ENCOUNTER — Other Ambulatory Visit: Payer: Self-pay

## 2020-06-04 DIAGNOSIS — R928 Other abnormal and inconclusive findings on diagnostic imaging of breast: Secondary | ICD-10-CM

## 2020-06-05 DIAGNOSIS — F4312 Post-traumatic stress disorder, chronic: Secondary | ICD-10-CM | POA: Diagnosis not present

## 2020-06-05 DIAGNOSIS — F411 Generalized anxiety disorder: Secondary | ICD-10-CM | POA: Diagnosis not present

## 2020-06-14 DIAGNOSIS — F4312 Post-traumatic stress disorder, chronic: Secondary | ICD-10-CM | POA: Diagnosis not present

## 2020-06-14 DIAGNOSIS — F411 Generalized anxiety disorder: Secondary | ICD-10-CM | POA: Diagnosis not present

## 2020-06-26 DIAGNOSIS — F411 Generalized anxiety disorder: Secondary | ICD-10-CM | POA: Diagnosis not present

## 2020-06-26 DIAGNOSIS — F4312 Post-traumatic stress disorder, chronic: Secondary | ICD-10-CM | POA: Diagnosis not present

## 2020-07-24 DIAGNOSIS — F4312 Post-traumatic stress disorder, chronic: Secondary | ICD-10-CM | POA: Diagnosis not present

## 2020-07-24 DIAGNOSIS — F411 Generalized anxiety disorder: Secondary | ICD-10-CM | POA: Diagnosis not present

## 2020-07-31 DIAGNOSIS — F411 Generalized anxiety disorder: Secondary | ICD-10-CM | POA: Diagnosis not present

## 2020-07-31 DIAGNOSIS — F4312 Post-traumatic stress disorder, chronic: Secondary | ICD-10-CM | POA: Diagnosis not present

## 2020-12-03 ENCOUNTER — Other Ambulatory Visit: Payer: Self-pay | Admitting: Internal Medicine

## 2020-12-03 DIAGNOSIS — N644 Mastodynia: Secondary | ICD-10-CM

## 2020-12-03 DIAGNOSIS — N6489 Other specified disorders of breast: Secondary | ICD-10-CM

## 2020-12-03 DIAGNOSIS — N6320 Unspecified lump in the left breast, unspecified quadrant: Secondary | ICD-10-CM

## 2020-12-04 NOTE — Telephone Encounter (Signed)
Pt is needing a diagnostic mammogram ordered for pain in left breast. She has been scheduled of an office appt to discuss other concerns.

## 2020-12-12 ENCOUNTER — Inpatient Hospital Stay: Admission: RE | Admit: 2020-12-12 | Payer: BC Managed Care – PPO | Source: Ambulatory Visit

## 2020-12-12 ENCOUNTER — Ambulatory Visit
Admission: RE | Admit: 2020-12-12 | Discharge: 2020-12-12 | Disposition: A | Discharge status: Home / Self Care | Payer: BC Managed Care – PPO | Source: Ambulatory Visit | Attending: Internal Medicine | Admitting: Internal Medicine

## 2020-12-12 ENCOUNTER — Other Ambulatory Visit: Payer: Self-pay

## 2020-12-12 DIAGNOSIS — N6320 Unspecified lump in the left breast, unspecified quadrant: Secondary | ICD-10-CM | POA: Insufficient documentation

## 2020-12-12 DIAGNOSIS — N644 Mastodynia: Secondary | ICD-10-CM | POA: Insufficient documentation

## 2020-12-12 DIAGNOSIS — N6489 Other specified disorders of breast: Secondary | ICD-10-CM | POA: Diagnosis not present

## 2020-12-13 ENCOUNTER — Other Ambulatory Visit: Payer: Self-pay

## 2020-12-13 ENCOUNTER — Ambulatory Visit: Payer: BC Managed Care – PPO | Admitting: Internal Medicine

## 2020-12-13 ENCOUNTER — Encounter: Payer: Self-pay | Admitting: Internal Medicine

## 2020-12-13 VITALS — BP 118/72 | HR 109 | Temp 96.5°F | Ht 65.0 in | Wt 134.8 lb

## 2020-12-13 DIAGNOSIS — E875 Hyperkalemia: Secondary | ICD-10-CM

## 2020-12-13 DIAGNOSIS — N6320 Unspecified lump in the left breast, unspecified quadrant: Secondary | ICD-10-CM

## 2020-12-13 DIAGNOSIS — R634 Abnormal weight loss: Secondary | ICD-10-CM | POA: Diagnosis not present

## 2020-12-13 LAB — TSH: TSH: 0.74 u[IU]/mL (ref 0.35–5.50)

## 2020-12-13 LAB — COMPREHENSIVE METABOLIC PANEL
ALT: 14 U/L (ref 0–35)
AST: 14 U/L (ref 0–37)
Albumin: 4.1 g/dL (ref 3.5–5.2)
Alkaline Phosphatase: 81 U/L (ref 39–117)
BUN: 12 mg/dL (ref 6–23)
CO2: 26 mEq/L (ref 19–32)
Calcium: 9.3 mg/dL (ref 8.4–10.5)
Chloride: 105 mEq/L (ref 96–112)
Creatinine, Ser: 0.73 mg/dL (ref 0.40–1.20)
GFR: 109.2 mL/min (ref >60.00)
Glucose, Bld: 62 mg/dL — ABNORMAL LOW (ref 70–99)
Potassium: 5.4 mEq/L — ABNORMAL HIGH (ref 3.5–5.1)
Sodium: 137 mEq/L (ref 135–145)
Total Bilirubin: 0.8 mg/dL (ref 0.2–1.2)
Total Protein: 6.6 g/dL (ref 6.0–8.3)

## 2020-12-13 LAB — CBC WITH DIFFERENTIAL/PLATELET
Basophils Absolute: 0 10*3/uL (ref 0.0–0.1)
Basophils Relative: 0.7 % (ref 0.0–3.0)
Eosinophils Absolute: 0.1 10*3/uL (ref 0.0–0.7)
Eosinophils Relative: 1.2 % (ref 0.0–5.0)
HCT: 40 % (ref 36.0–46.0)
Hemoglobin: 12.8 g/dL (ref 12.0–15.0)
Lymphocytes Relative: 29.2 % (ref 12.0–46.0)
Lymphs Abs: 1.9 10*3/uL (ref 0.7–4.0)
MCHC: 32.1 g/dL (ref 30.0–36.0)
MCV: 84.1 fl (ref 78.0–100.0)
Monocytes Absolute: 0.7 10*3/uL (ref 0.1–1.0)
Monocytes Relative: 10.1 % (ref 3.0–12.0)
Neutro Abs: 3.8 10*3/uL (ref 1.4–7.7)
Neutrophils Relative %: 58.8 % (ref 43.0–77.0)
Platelets: 333 10*3/uL (ref 150.0–400.0)
RBC: 4.76 Mil/uL (ref 3.87–5.11)
RDW: 14.9 % (ref 11.5–15.5)
WBC: 6.5 10*3/uL (ref 4.0–10.5)

## 2020-12-13 NOTE — Progress Notes (Signed)
Subjective:  Patient ID: Shelby Terry, female    DOB: [birth year]  Age: 30 y.o. MRN: [id number]  CC: The primary encounter diagnosis was Excessive body weight loss. Diagnoses of Left breast mass and Abnormal intentional weight loss were also pertinent to this visit.  HPI Shelby Terry presents for follow up on abnormal mammograms  This visit occurred during the SARS-CoV-2 public health emergency.  Safety protocols were in place, including screening questions prior to the visit, additional usage of staff PPE, and extensive cleaning of exam room while observing appropriate contact time as indicated for disinfecting solutions.   She underwent diagnostic mammogram and ultrasound to evaluate a small mass in the left breast that was found by screening mammogram.  Ultrasound guided biopsy was deferred as her genetics testing was done and she was negative for BRCA mutations despite her strong paternal history of breast Cancer   REpeat imaging was done last week at 6 month follow up and no change was seen .   Since her last visit she has lost 16 lbs intentionally after a breakup with her girlfriend.  She has been exercising regularly and following a careful diet .  She feels much better and feels that all of her previous health concerns have resolved since she resumed a healthier lifestyle.   Outpatient Medications Prior to Visit  Medication Sig Dispense Refill   acetaminophen (TYLENOL) 325 MG tablet Take 325 mg every 4 (four) hours as needed by mouth. (Patient not taking: Reported on 12/13/2020)     benzonatate (TESSALON) 100 MG capsule Take 1 capsule (100 mg total) by mouth 2 (two) times daily as needed for cough. (Patient not taking: Reported on 12/13/2020) 20 capsule 0   cetirizine (ZYRTEC) 10 MG tablet Take 10 mg by mouth daily. (Patient not taking: Reported on 12/13/2020)     fluticasone (FLOVENT HFA) 110 MCG/ACT inhaler Inhale 1 puff into the lungs in the morning and at bedtime. (Patient not  taking: Reported on 12/13/2020) 1 each 0   predniSONE (DELTASONE) 10 MG tablet 6 tablets on Day 1 , then reduce by 1 tablet daily until gone (Patient not taking: Reported on 12/13/2020) 21 tablet 0   triamcinolone cream (KENALOG) 0.1 % Apply 1 application topically 2 (two) times daily. (Patient not taking: Reported on 12/13/2020) 30 g 2   No facility-administered medications prior to visit.    Review of Systems;  Patient denies headache, fevers, malaise, unintentional weight loss, skin rash, eye pain, sinus congestion and sinus pain, sore throat, dysphagia,  hemoptysis , cough, dyspnea, wheezing, chest pain, palpitations, orthopnea, edema, abdominal pain, nausea, melena, diarrhea, constipation, flank pain, dysuria, hematuria, urinary  Frequency, nocturia, numbness, tingling, seizures,  Focal weakness, Loss of consciousness,  Tremor, insomnia, depression, anxiety, and suicidal ideation.      Objective:  BP 118/72   Pulse (!) 109   Temp (!) 96.5 F (35.8 C)   Ht 5' 5"  (1.651 m)   Wt 134 lb 12.8 oz (61.1 kg)   LMP 11/21/2020   SpO2 99%   BMI 22.43 kg/m   BP Readings from Last 3 Encounters:  12/13/20 118/72  02/02/18 100/72  08/12/17 116/83    Wt Readings from Last 3 Encounters:  12/13/20 134 lb 12.8 oz (61.1 kg)  05/13/20 140 lb (63.5 kg)  12/25/19 150 lb (68 kg)    General appearance: alert, cooperative and appears stated age Ears: normal TM's and external ear canals both ears Throat: lips, mucosa, and tongue normal; teeth  and gums normal Neck: no adenopathy, no carotid bruit, supple, symmetrical, trachea midline and thyroid not enlarged, symmetric, no tenderness/mass/nodules Back: symmetric, no curvature. ROM normal. No CVA tenderness. Lungs: clear to auscultation bilaterally Heart: regular rate and rhythm, S1, S2 normal, no murmur, click, rub or gallop Abdomen: soft, non-tender; bowel sounds normal; no masses,  no organomegaly Pulses: 2+ and symmetric Skin: Skin color,  texture, turgor normal. No rashes or lesions Lymph nodes: Cervical, supraclavicular, and axillary nodes normal.  Lab Results  Component Value Date   HGBA1C 5.0 05/01/2019    Lab Results  Component Value Date   CREATININE 0.73 12/13/2020   CREATININE 0.93 05/01/2019   CREATININE 0.70 02/02/2018    Lab Results  Component Value Date   WBC 6.5 12/13/2020   HGB 12.8 12/13/2020   HCT 40.0 12/13/2020   PLT 333.0 12/13/2020   GLUCOSE 62 (L) 12/13/2020   CHOL 339 (H) 05/01/2019   TRIG 208 (H) 05/01/2019   HDL 74 05/01/2019   LDLDIRECT 168.0 08/06/2017   LDLCALC 225 (H) 05/01/2019   ALT 14 12/13/2020   AST 14 12/13/2020   NA 137 12/13/2020   K 5.4 No hemolysis seen (H) 12/13/2020   CL 105 12/13/2020   CREATININE 0.73 12/13/2020   BUN 12 12/13/2020   CO2 26 12/13/2020   TSH 0.74 12/13/2020   HGBA1C 5.0 05/01/2019    US BREAST LTD UNI LEFT INC AXILLA  Result Date: 12/12/2020 CLINICAL DATA:  Follow-up for probably benign mass in the LEFT breast. This probably benign finding was originally identified in October of 2021. EXAM: DIGITAL DIAGNOSTIC UNILATERAL LEFT MAMMOGRAM WITH TOMOSYNTHESIS AND CAD; ULTRASOUND LEFT BREAST LIMITED TECHNIQUE: Left digital diagnostic mammography and breast tomosynthesis was performed. The images were evaluated with computer-aided detection.; Targeted ultrasound examination of the left breast was performed. COMPARISON:  Previous exams including LEFT breast diagnostic mammogram and ultrasound dated 03/08/2020. ACR Breast Density Category c: The breast tissue is heterogeneously dense, which may obscure small masses. FINDINGS: LEFT breast diagnostic mammogram: Partially obscured low-density mass is seen within the lower inner quadrant of the LEFT breast. Targeted ultrasound is performed, again showing a probably benign cyst in the LEFT breast at the 8 o'clock axis, 5 cm from the nipple, measuring 3 mm, a likely correlate for the mammographic finding. Additional  focal fibrocystic changes are seen within the LEFT breast at the 9 o'clock axis, 5 cm from the nipple, corresponding as an incidental finding. No suspicious solid or cystic mass is identified by ultrasound. IMPRESSION: 1. Probably benign cyst in the LEFT breast at the 8 o'clock axis, 5 cm from the nipple, measuring 3 mm. Recommend additional follow-up diagnostic mammogram and ultrasound in 6 months to ensure continued stability. 2. Additional benign focal fibrocystic change within the LEFT breast at the 9 o'clock axis, corresponding as an incidental finding. RECOMMENDATION: 1. LEFT breast diagnostic mammogram and ultrasound in 6 months. 2. Given patient's family history of breast cancer and dense breasts, recommend performing a risk assessment calculation for breast cancer, preferably using the Tyrer-Cuzick or Baker Janus model, to determine if early commencement of an annual screening mammogram schedule should begin now. Per American cancer society guidelines, early commencement of routine annual screening mammography would be indicated if a risk assessment calculation for breast cancer, preferably using the Tyrer-Cuzick or Gail model, measures greater than 20%. If greater than 20%, a bilateral diagnostic mammogram would be recommended in 6 months. This was discussed with the patient today. I have discussed the findings and  recommendations with the patient. If applicable, a reminder letter will be sent to the patient regarding the next appointment. BI-RADS CATEGORY  3: Probably benign. Electronically Signed   By: Franki Cabot M.D.   On: 12/12/2020 15:19  MM DIAG BREAST TOMO UNI LEFT  Result Date: 12/12/2020 CLINICAL DATA:  Follow-up for probably benign mass in the LEFT breast. This probably benign finding was originally identified in October of 2021. EXAM: DIGITAL DIAGNOSTIC UNILATERAL LEFT MAMMOGRAM WITH TOMOSYNTHESIS AND CAD; ULTRASOUND LEFT BREAST LIMITED TECHNIQUE: Left digital diagnostic mammography and breast  tomosynthesis was performed. The images were evaluated with computer-aided detection.; Targeted ultrasound examination of the left breast was performed. COMPARISON:  Previous exams including LEFT breast diagnostic mammogram and ultrasound dated 03/08/2020. ACR Breast Density Category c: The breast tissue is heterogeneously dense, which may obscure small masses. FINDINGS: LEFT breast diagnostic mammogram: Partially obscured low-density mass is seen within the lower inner quadrant of the LEFT breast. Targeted ultrasound is performed, again showing a probably benign cyst in the LEFT breast at the 8 o'clock axis, 5 cm from the nipple, measuring 3 mm, a likely correlate for the mammographic finding. Additional focal fibrocystic changes are seen within the LEFT breast at the 9 o'clock axis, 5 cm from the nipple, corresponding as an incidental finding. No suspicious solid or cystic mass is identified by ultrasound. IMPRESSION: 1. Probably benign cyst in the LEFT breast at the 8 o'clock axis, 5 cm from the nipple, measuring 3 mm. Recommend additional follow-up diagnostic mammogram and ultrasound in 6 months to ensure continued stability. 2. Additional benign focal fibrocystic change within the LEFT breast at the 9 o'clock axis, corresponding as an incidental finding. RECOMMENDATION: 1. LEFT breast diagnostic mammogram and ultrasound in 6 months. 2. Given patient's family history of breast cancer and dense breasts, recommend performing a risk assessment calculation for breast cancer, preferably using the Tyrer-Cuzick or Baker Janus model, to determine if early commencement of an annual screening mammogram schedule should begin now. Per American cancer society guidelines, early commencement of routine annual screening mammography would be indicated if a risk assessment calculation for breast cancer, preferably using the Tyrer-Cuzick or Gail model, measures greater than 20%. If greater than 20%, a bilateral diagnostic mammogram would  be recommended in 6 months. This was discussed with the patient today. I have discussed the findings and recommendations with the patient. If applicable, a reminder letter will be sent to the patient regarding the next appointment. BI-RADS CATEGORY  3: Probably benign. Electronically Signed   By: Franki Cabot M.D.   On: 12/12/2020 15:19   Assessment & Plan:   Problem List Items Addressed This Visit       Unprioritized   Left breast mass    Unchanged by 6 month follow up . Patient prefers to defer biopsy for 6 months provided  Dr Barry Dienes agrees        Abnormal intentional weight loss    She has lost >10% of her body weight following a emotional break up and has changed jobs as well.  She appears very happy and notes that all of her chronic issues (fibromyalgia, chronic cystitis) have resolved .  Screening labs are normal.   Lab Results  Component Value Date   TSH 0.74 12/13/2020   Lab Results  Component Value Date   WBC 6.5 12/13/2020   HGB 12.8 12/13/2020   HCT 40.0 12/13/2020   MCV 84.1 12/13/2020   PLT 333.0 12/13/2020   Lab Results  Component Value Date  ALT 14 12/13/2020   AST 14 12/13/2020   ALKPHOS 81 12/13/2020   BILITOT 0.8 12/13/2020   Lab Results  Component Value Date   CREATININE 0.73 12/13/2020         Other Visit Diagnoses     Excessive body weight loss    -  Primary   Relevant Orders   TSH (Completed)   Comprehensive metabolic panel (Completed)   CBC with Differential/Platelet (Completed)       I am having Celisa R. Barsky maintain her triamcinolone cream, acetaminophen, cetirizine, predniSONE, Flovent HFA, and benzonatate.  No orders of the defined types were placed in this encounter.   There are no discontinued medications.  Follow-up: Return in about 3 months (around 03/15/2021).   Crecencio Mc, MD

## 2020-12-13 NOTE — Patient Instructions (Signed)
Good to see you!  And I 'm glad you are doing so well   I'll make sure Dr Donell Beers sees the images from yesterday

## 2020-12-15 ENCOUNTER — Encounter: Payer: Self-pay | Admitting: Internal Medicine

## 2020-12-15 DIAGNOSIS — R634 Abnormal weight loss: Secondary | ICD-10-CM | POA: Insufficient documentation

## 2020-12-15 NOTE — Assessment & Plan Note (Addendum)
She has lost >10% of her body weight following a emotional break up and has changed jobs as well.  She appears very happy and notes that all of her chronic issues (fibromyalgia, chronic cystitis) have resolved .  Screening labs are normal.   Lab Results  Component Value Date   TSH 0.74 12/13/2020   Lab Results  Component Value Date   WBC 6.5 12/13/2020   HGB 12.8 12/13/2020   HCT 40.0 12/13/2020   MCV 84.1 12/13/2020   PLT 333.0 12/13/2020   Lab Results  Component Value Date   ALT 14 12/13/2020   AST 14 12/13/2020   ALKPHOS 81 12/13/2020   BILITOT 0.8 12/13/2020   Lab Results  Component Value Date   CREATININE 0.73 12/13/2020

## 2020-12-15 NOTE — Assessment & Plan Note (Signed)
Unchanged by 6 month follow up . Patient prefers to defer biopsy for 6 months provided  Dr Donell Beers agrees

## 2020-12-18 DIAGNOSIS — E875 Hyperkalemia: Secondary | ICD-10-CM

## 2020-12-23 NOTE — Telephone Encounter (Signed)
Placed in quick sign folder for signature.  

## 2021-01-09 ENCOUNTER — Other Ambulatory Visit: Payer: Self-pay

## 2021-01-09 ENCOUNTER — Other Ambulatory Visit (INDEPENDENT_AMBULATORY_CARE_PROVIDER_SITE_OTHER): Payer: BC Managed Care – PPO

## 2021-01-09 DIAGNOSIS — E875 Hyperkalemia: Secondary | ICD-10-CM | POA: Diagnosis not present

## 2021-01-09 LAB — BASIC METABOLIC PANEL
BUN: 10 mg/dL (ref 6–23)
CO2: 25 mEq/L (ref 19–32)
Calcium: 9.5 mg/dL (ref 8.4–10.5)
Chloride: 104 mEq/L (ref 96–112)
Creatinine, Ser: 0.8 mg/dL (ref 0.40–1.20)
GFR: 97.78 mL/min (ref >60.00)
Glucose, Bld: 83 mg/dL (ref 70–99)
Potassium: 4.8 mEq/L (ref 3.5–5.1)
Sodium: 137 mEq/L (ref 135–145)

## 2021-03-28 ENCOUNTER — Other Ambulatory Visit (HOSPITAL_COMMUNITY)
Admission: RE | Admit: 2021-03-28 | Discharge: 2021-03-28 | Disposition: A | Discharge status: Home / Self Care | Payer: BC Managed Care – PPO | Source: Ambulatory Visit | Attending: Internal Medicine | Admitting: Internal Medicine

## 2021-03-28 ENCOUNTER — Encounter: Payer: Self-pay | Admitting: Internal Medicine

## 2021-03-28 ENCOUNTER — Ambulatory Visit (INDEPENDENT_AMBULATORY_CARE_PROVIDER_SITE_OTHER): Payer: BC Managed Care – PPO | Admitting: Internal Medicine

## 2021-03-28 ENCOUNTER — Other Ambulatory Visit: Payer: Self-pay

## 2021-03-28 VITALS — BP 94/68 | HR 91 | Temp 96.7°F | Ht 65.0 in | Wt 136.4 lb

## 2021-03-28 DIAGNOSIS — N921 Excessive and frequent menstruation with irregular cycle: Secondary | ICD-10-CM

## 2021-03-28 DIAGNOSIS — Z0000 Encounter for general adult medical examination without abnormal findings: Secondary | ICD-10-CM

## 2021-03-28 DIAGNOSIS — N926 Irregular menstruation, unspecified: Secondary | ICD-10-CM | POA: Diagnosis not present

## 2021-03-28 DIAGNOSIS — R7301 Impaired fasting glucose: Secondary | ICD-10-CM | POA: Diagnosis not present

## 2021-03-28 DIAGNOSIS — N6320 Unspecified lump in the left breast, unspecified quadrant: Secondary | ICD-10-CM

## 2021-03-28 DIAGNOSIS — Z1231 Encounter for screening mammogram for malignant neoplasm of breast: Secondary | ICD-10-CM

## 2021-03-28 DIAGNOSIS — E785 Hyperlipidemia, unspecified: Secondary | ICD-10-CM

## 2021-03-28 DIAGNOSIS — Z23 Encounter for immunization: Secondary | ICD-10-CM

## 2021-03-28 DIAGNOSIS — R5383 Other fatigue: Secondary | ICD-10-CM

## 2021-03-28 DIAGNOSIS — Z124 Encounter for screening for malignant neoplasm of cervix: Secondary | ICD-10-CM | POA: Diagnosis not present

## 2021-03-28 DIAGNOSIS — E538 Deficiency of other specified B group vitamins: Secondary | ICD-10-CM | POA: Diagnosis not present

## 2021-03-28 LAB — COMPREHENSIVE METABOLIC PANEL
ALT: 11 U/L (ref 0–35)
AST: 13 U/L (ref 0–37)
Albumin: 4.2 g/dL (ref 3.5–5.2)
Alkaline Phosphatase: 73 U/L (ref 39–117)
BUN: 11 mg/dL (ref 6–23)
CO2: 25 mEq/L (ref 19–32)
Calcium: 9.3 mg/dL (ref 8.4–10.5)
Chloride: 106 mEq/L (ref 96–112)
Creatinine, Ser: 0.72 mg/dL (ref 0.40–1.20)
GFR: 110.79 mL/min (ref >60.00)
Glucose, Bld: 85 mg/dL (ref 70–99)
Potassium: 4.9 mEq/L (ref 3.5–5.1)
Sodium: 138 mEq/L (ref 135–145)
Total Bilirubin: 0.9 mg/dL (ref 0.2–1.2)
Total Protein: 6.8 g/dL (ref 6.0–8.3)

## 2021-03-28 LAB — LIPID PANEL
Cholesterol: 226 mg/dL — ABNORMAL HIGH (ref 0–200)
HDL: 85 mg/dL (ref >39.00)
LDL Cholesterol: 120 mg/dL — ABNORMAL HIGH (ref 0–99)
NonHDL: 140.81
Total CHOL/HDL Ratio: 3
Triglycerides: 104 mg/dL (ref 0.0–149.0)
VLDL: 20.8 mg/dL (ref 0.0–40.0)

## 2021-03-28 LAB — CBC WITH DIFFERENTIAL/PLATELET
Basophils Absolute: 0 10*3/uL (ref 0.0–0.1)
Basophils Relative: 0.5 % (ref 0.0–3.0)
Eosinophils Absolute: 0.1 10*3/uL (ref 0.0–0.7)
Eosinophils Relative: 1.1 % (ref 0.0–5.0)
HCT: 40.1 % (ref 36.0–46.0)
Hemoglobin: 12.9 g/dL (ref 12.0–15.0)
Lymphocytes Relative: 34.8 % (ref 12.0–46.0)
Lymphs Abs: 2.1 10*3/uL (ref 0.7–4.0)
MCHC: 32.3 g/dL (ref 30.0–36.0)
MCV: 83.4 fl (ref 78.0–100.0)
Monocytes Absolute: 0.5 10*3/uL (ref 0.1–1.0)
Monocytes Relative: 8.4 % (ref 3.0–12.0)
Neutro Abs: 3.3 10*3/uL (ref 1.4–7.7)
Neutrophils Relative %: 55.2 % (ref 43.0–77.0)
Platelets: 327 10*3/uL (ref 150.0–400.0)
RBC: 4.8 Mil/uL (ref 3.87–5.11)
RDW: 14 % (ref 11.5–15.5)
WBC: 6 10*3/uL (ref 4.0–10.5)

## 2021-03-28 LAB — TSH: TSH: 1.17 u[IU]/mL (ref 0.35–5.50)

## 2021-03-28 LAB — VITAMIN B12: Vitamin B-12: 227 pg/mL (ref 211–911)

## 2021-03-28 LAB — HEMOGLOBIN A1C: Hgb A1c MFr Bld: 5.2 % (ref 4.6–6.5)

## 2021-03-28 NOTE — Assessment & Plan Note (Signed)
PAP smear done; mammogram ordered   age appropriate education and counseling updated, referrals for preventative services and immunizations addressed, dietary and smoking counseling addressed, most recent labs reviewed.  I have personally reviewed and have noted:  1) the patient's medical and social history 2) The pt's use of alcohol, tobacco, and illicit drugs 3) The patient's current medications and supplements 4) Functional ability including ADL's, fall risk, home safety risk, hearing and visual impairment 5) Diet and physical activities 6) Evidence for depression or mood disorder 7) The patient's height, weight, and BMI have been recorded in the chart  I have made referrals, and provided counseling and education based on review of the above

## 2021-03-28 NOTE — Addendum Note (Signed)
Addended by: Elise Benne T on: 03/28/2021 08:58 AM   Modules accepted: Orders

## 2021-03-28 NOTE — Assessment & Plan Note (Signed)
Continue 6 month follow up for a total of 2 years.  Given FH of CA will continue annual screening as well

## 2021-03-28 NOTE — Patient Instructions (Signed)
I and [facility]i are praying for your father Shelby Terry and your entire family   I will reach out to Dr. Donell Beers to let her know about your father's diagnosis  Screening bilateral mammogram and diagnostic mammogram of left breast have been ordered  Your received the TdaP and flu vaccines today

## 2021-03-28 NOTE — Progress Notes (Signed)
Patient ID: Shelby Terry, female    DOB: 03-14-1991  Age: 30 y.o. MRN: [id number]  The patient is here for  preventive examination and management of other chronic and acute problems.   The risk factors are reflected in the social history.  The roster of all physicians providing medical care to patient - is listed in the Snapshot section of the chart.  Activities of daily living:  The patient is 100% independent in all ADLs: dressing, toileting, feeding as well as independent mobility  Home safety : The patient has smoke detectors in the home. They wear seatbelts.  There are no firearms at home. There is no violence in the home.   There is no risks for hepatitis, STDs or HIV. There is no   history of blood transfusion. They have no travel history to infectious disease endemic areas of the world.  The patient has seen their dentist in the last six month. They have seen their eye doctor in the last year. They admit to slight hearing difficulty with regard to whispered voices and some television programs.  They have deferred audiologic testing in the last year.  They do not  have excessive sun exposure. Discussed the need for sun protection: hats, long sleeves and use of sunscreen if there is significant sun exposure.   Diet: the importance of a healthy diet is discussed. They do have a healthy diet.  The benefits of regular aerobic exercise were discussed. She walks 4 times per week ,  20 minutes.   Depression screen: there are no signs or vegative symptoms of depression- irritability, change in appetite, anhedonia, sadness/tearfullness.  Cognitive assessment: the patient manages all their financial and personal affairs and is actively engaged. They could relate day,date,year and events; recalled 2/3 objects at 3 minutes; performed clock-face test normally.  The following portions of the patient's history were reviewed and updated as appropriate: allergies, current medications, past family  history, past medical history,  past surgical history, past social history  and problem list.  Visual acuity was not assessed per patient preference since she has regular follow up with her ophthalmologist. Hearing and body mass index were assessed and reviewed.   During the course of the visit the patient was educated and counseled about appropriate screening and preventive services including : fall prevention , diabetes screening, nutrition counseling, colorectal cancer screening, and recommended immunizations.    CC: The primary encounter diagnosis was Visit for preventive health examination. Diagnoses of Screening for cervical cancer, Hyperlipidemia LDL goal <160, Impaired fasting glucose, Fatigue, unspecified type, Breast cancer screening by mammogram, Mass of left breast, unspecified quadrant, Menstrual irregularity, B12 deficiency, and Metrorrhagia were also pertinent to this visit.  1) Father diagnosed with stage 4 lung CA Shelby Terry).  2) Breast center recommending mammogram every 6 months for 2 years due to family    History Shelby Terry has a past medical history of Allergy, Anxiety, Chicken pox (1994), Chronic interstitial cystitis (10/25/2012), Depression, Fibromyalgia, Interstitial cystitis, Irritable bowel syndrome (y-10), and Migraines (2008).   She has no past surgical history on file.   Her family history includes Asthma in her brother; Breast cancer (age of onset: 69) in her paternal aunt; Breast cancer (age of onset: 67) in her paternal grandmother; Cancer (age of onset: 78) in her father; Cervical cancer in her maternal grandmother; Colon polyps in her father and maternal grandfather; Diabetes in her father, maternal grandfather, maternal uncle, and mother; Emphysema in her paternal grandfather; Hyperlipidemia in her father, maternal  grandfather, and mother; Hypertension in her father and maternal grandfather; Liver disease in her maternal uncle; Lung cancer in her paternal grandfather;  Prostate cancer in her maternal uncle.She reports that she has never smoked. She has never used smokeless tobacco. She reports current alcohol use. She reports that she does not use drugs.  Outpatient Medications Prior to Visit  Medication Sig Dispense Refill   acetaminophen (TYLENOL) 325 MG tablet Take 325 mg every 4 (four) hours as needed by mouth. (Patient not taking: Reported on 12/13/2020)     benzonatate (TESSALON) 100 MG capsule Take 1 capsule (100 mg total) by mouth 2 (two) times daily as needed for cough. (Patient not taking: Reported on 12/13/2020) 20 capsule 0   cetirizine (ZYRTEC) 10 MG tablet Take 10 mg by mouth daily. (Patient not taking: Reported on 12/13/2020)     fluticasone (FLOVENT HFA) 110 MCG/ACT inhaler Inhale 1 puff into the lungs in the morning and at bedtime. (Patient not taking: Reported on 12/13/2020) 1 each 0   predniSONE (DELTASONE) 10 MG tablet 6 tablets on Day 1 , then reduce by 1 tablet daily until gone (Patient not taking: Reported on 12/13/2020) 21 tablet 0   triamcinolone cream (KENALOG) 0.1 % Apply 1 application topically 2 (two) times daily. (Patient not taking: Reported on 12/13/2020) 30 g 2   No facility-administered medications prior to visit.    Review of Systems  Patient denies headache, fevers, malaise, unintentional weight loss, skin rash, eye pain, sinus congestion and sinus pain, sore throat, dysphagia,  hemoptysis , cough, dyspnea, wheezing, chest pain, palpitations, orthopnea, edema, abdominal pain, nausea, melena, diarrhea, constipation, flank pain, dysuria, hematuria, urinary  Frequency, nocturia, numbness, tingling, seizures,  Focal weakness, Loss of consciousness,  Tremor, insomnia, depression, anxiety, and suicidal ideation.     Objective:  BP 94/68 (BP Location: Left Arm, Patient Position: Sitting, Cuff Size: Normal)   Pulse 91   Temp (!) 96.7 F (35.9 C) (Temporal)   Ht $R'5\' 5"'Yz$  (1.651 m)   Wt 136 lb 6.4 oz (61.9 kg)   SpO2 99%   BMI 22.70 kg/m    Physical Exam   General Appearance:    Alert, cooperative, no distress, appears stated age  Head:    Normocephalic, without obvious abnormality, atraumatic  Eyes:    PERRL, conjunctiva/corneas clear, EOM's intact, fundi    benign, both eyes  Ears:    Normal TM's and external ear canals, both ears  Nose:   Nares normal, septum midline, mucosa normal, no drainage    or sinus tenderness  Throat:   Lips, mucosa, and tongue normal; teeth and gums normal  Neck:   Supple, symmetrical, trachea midline, no adenopathy;    thyroid:  no enlargement/tenderness/nodules; no carotid   bruit or JVD  Back:     Symmetric, no curvature, ROM normal, no CVA tenderness  Lungs:     Clear to auscultation bilaterally, respirations unlabored  Chest Wall:    No tenderness or deformity   Heart:    Regular rate and rhythm, S1 and S2 normal, no murmur, rub   or gallop  Breast Exam:    No tenderness, masses, or nipple abnormality  Abdomen:     Soft, non-tender, bowel sounds active all four quadrants,    no masses, no organomegaly  Genitalia:    Pelvic: cervix normal in appearance, external genitalia normal, no adnexal masses or tenderness, no cervical motion tenderness, rectovaginal septum normal, uterus normal size, shape, and consistency and vagina normal without discharge  Extremities:   Extremities normal, atraumatic, no cyanosis or edema  Pulses:   2+ and symmetric all extremities  Skin:   Skin color, texture, turgor normal, no rashes or lesions  Lymph nodes:   Cervical, supraclavicular, and axillary nodes normal  Neurologic:   CNII-XII intact, normal strength, sensation and reflexes    throughout    Assessment & Plan:   Problem List Items Addressed This Visit     Visit for preventive health examination - Primary    PAP smear done; mammogram ordered   age appropriate education and counseling updated, referrals for preventative services and immunizations addressed, dietary and smoking counseling  addressed, most recent labs reviewed.  I have personally reviewed and have noted:   1) the patient's medical and social history 2) The pt's use of alcohol, tobacco, and illicit drugs 3) The patient's current medications and supplements 4) Functional ability including ADL's, fall risk, home safety risk, hearing and visual impairment 5) Diet and physical activities 6) Evidence for depression or mood disorder 7) The patient's height, weight, and BMI have been recorded in the chart   I have made referrals, and provided counseling and education based on review of the above      Left breast mass    Continue 6 month follow up for a total of 2 years.  Given FH of CA will continue annual screening as well       Relevant Orders   MM DIAG BREAST TOMO BILATERAL   Metrorrhagia    Chronic,  Previously controlled with hormonal contraceptives, which she has stopped.  Checking thyroid function and CBC      Other Visit Diagnoses     Screening for cervical cancer       Relevant Orders   Cytology - PAP( [place])   Hyperlipidemia LDL goal <160       Relevant Orders   Lipid Profile   Impaired fasting glucose       Relevant Orders   Comp Met (CMET)   HgB A1c   Fatigue, unspecified type       Relevant Orders   CBC with Differential/Platelet   Breast cancer screening by mammogram       Relevant Orders   MM 3D SCREEN BREAST BILATERAL   Menstrual irregularity       Relevant Orders   TSH   B12 deficiency       Relevant Orders   Vitamin B12       I have discontinued Estill Bamberg R. Martindale's triamcinolone cream, acetaminophen, cetirizine, predniSONE, Flovent HFA, and benzonatate.  No orders of the defined types were placed in this encounter.   Medications Discontinued During This Encounter  Medication Reason   acetaminophen (TYLENOL) 325 MG tablet    benzonatate (TESSALON) 100 MG capsule    cetirizine (ZYRTEC) 10 MG tablet    fluticasone (FLOVENT HFA) 110 MCG/ACT inhaler    predniSONE  (DELTASONE) 10 MG tablet    triamcinolone cream (KENALOG) 0.1 %     Follow-up: No follow-ups on file.   Crecencio Mc, MD

## 2021-03-28 NOTE — Assessment & Plan Note (Signed)
Chronic,  Previously controlled with hormonal contraceptives, which she has stopped.  Checking thyroid function and CBC

## 2021-03-30 DIAGNOSIS — E538 Deficiency of other specified B group vitamins: Secondary | ICD-10-CM | POA: Insufficient documentation

## 2021-03-30 NOTE — Addendum Note (Signed)
Addended by: Sherlene Shams on: 03/30/2021 01:28 PM   Modules accepted: Orders

## 2021-04-02 LAB — CYTOLOGY - PAP
Comment: NEGATIVE
Diagnosis: NEGATIVE
High risk HPV: NEGATIVE

## 2021-07-06 IMAGING — US US BREAST*L* LIMITED INC AXILLA
1 series · 7 of 7 positions shown · non-contrast
Comparison: Screening mammogram dated 02/21/2020.
COMPARISON: Screening mammogram dated 02/21/2020.

Addendum:
CLINICAL DATA: Screening recall from baseline mammography for left
breast asymmetry.

EXAM:
DIGITAL DIAGNOSTIC UNILATERAL LEFT MAMMOGRAM WITH TOMO AND CAD;
ULTRASOUND LEFT BREAST LIMITED

[Series 1: us breast*left* limited inc axilla · 0.05mm/px · 7 of 7 slices shown]
[im 1/7]
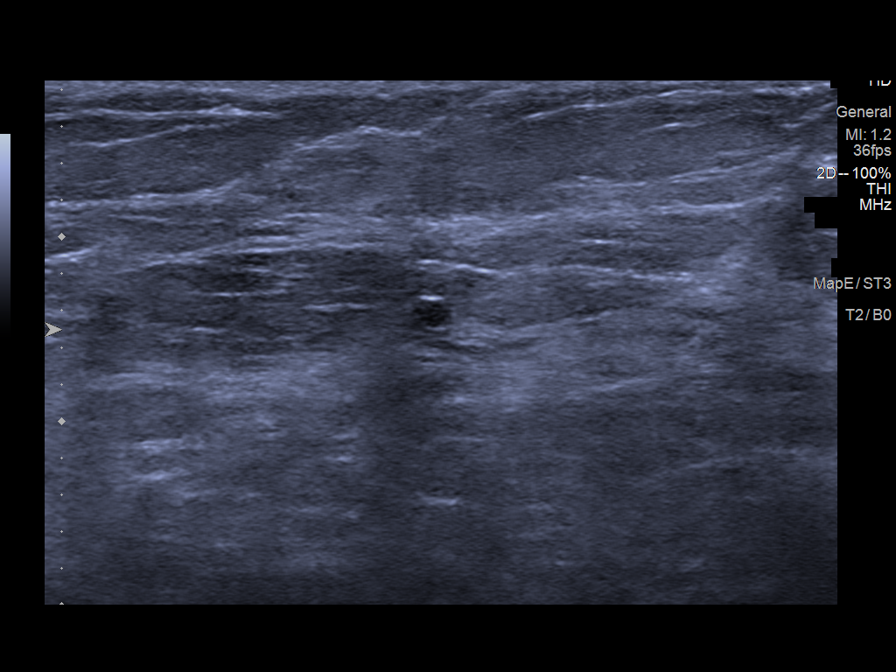
[im 2/7]
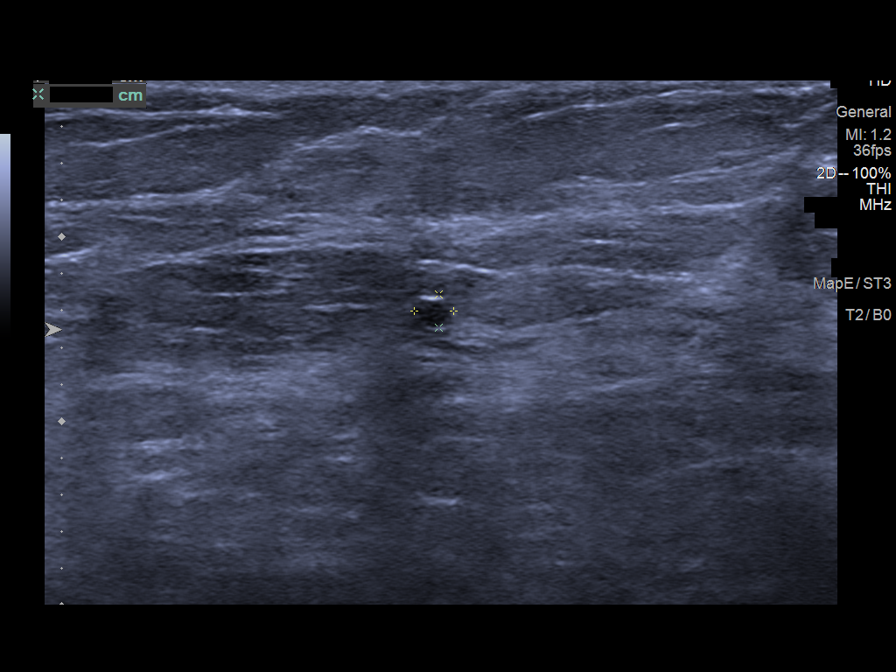
[im 3/7]
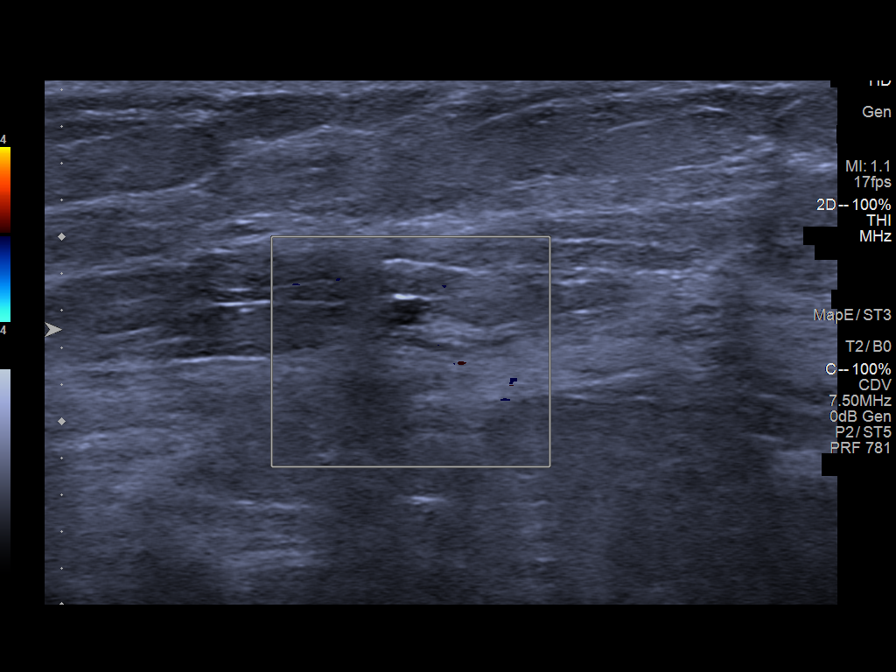
[im 4/7]
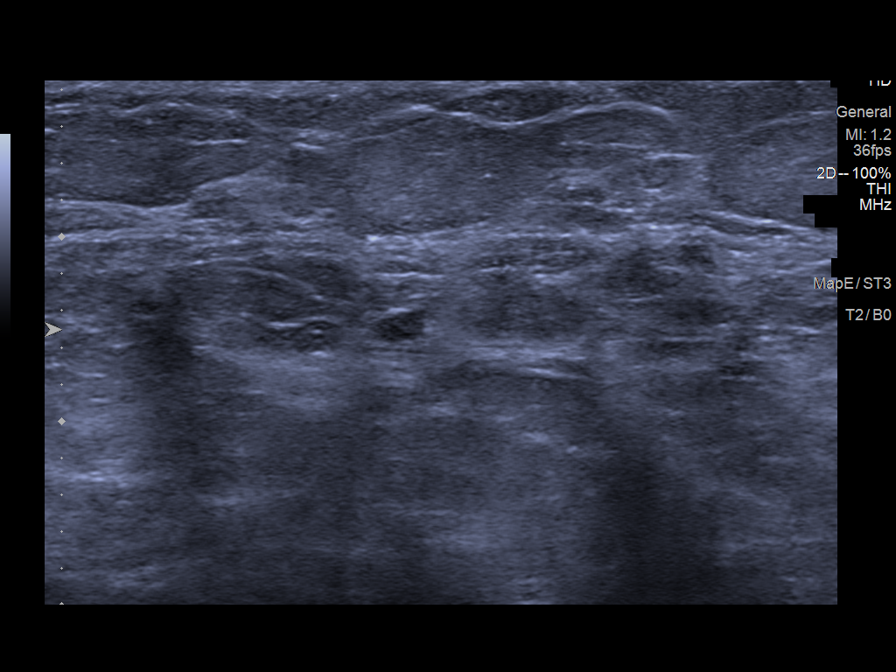
[im 5/7]
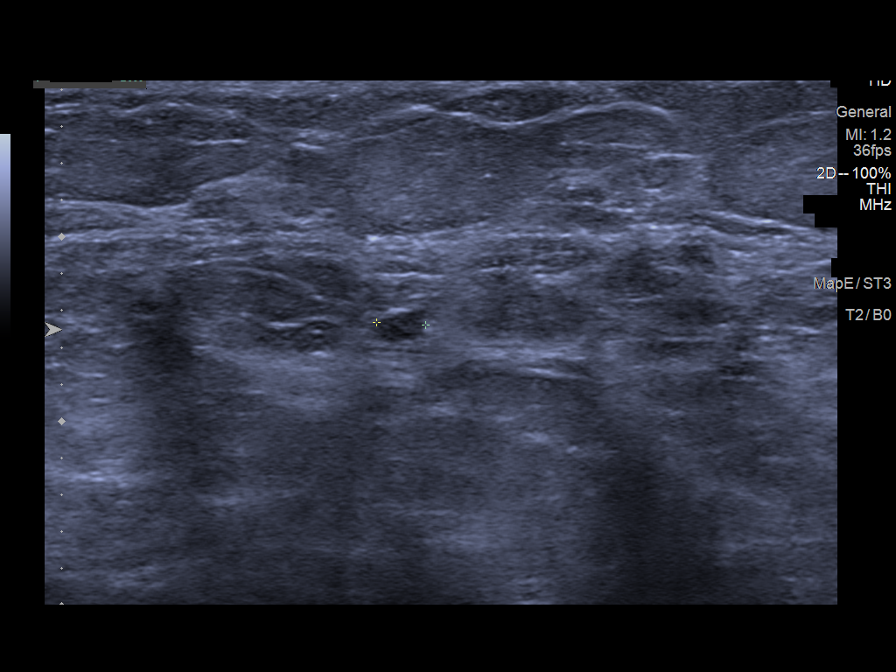
[im 6/7]
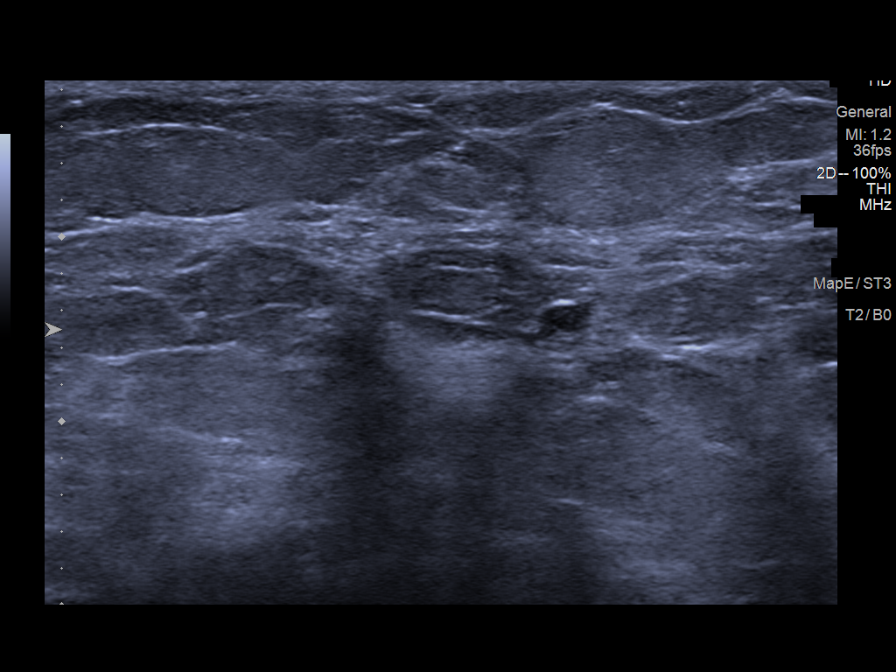
[im 7/7]
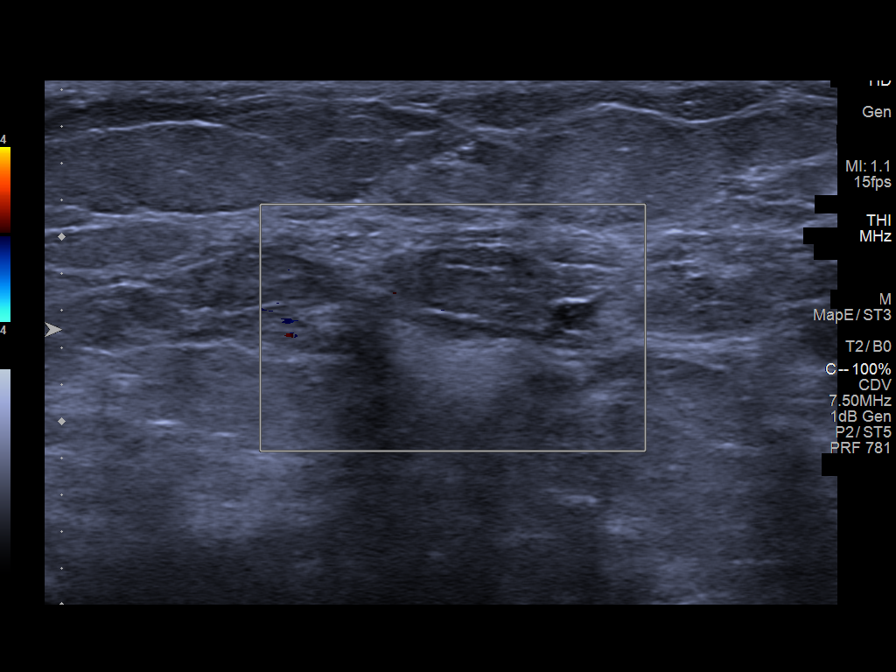

[7 of 7 positions shown; findings below may reference images not displayed]

ACR Breast Density Category c: The breast tissue is heterogeneously
dense, which may obscure small masses.
FINDINGS: Additional tomograms were performed of the left breast. The
initially questioned possible left breast asymmetry is less apparent
and felt to be related to dense fibroglandular tissue.

Mammographic images were processed with CAD.

Targeted ultrasound of the inner left breast was performed. A tiny
probable complicated cyst at 8 o'clock 5 cm from nipple measures
x 0.2 x 0.3 cm. No suspicious masses or abnormalities seen in the
inner left breast, only heterogeneous fibroglandular tissue. This
heterogeneous fibroglandular tissue likely accounts for the
asymmetry seen in the inner left breast at mammography.
IMPRESSION: Probably benign left breast mass.

RECOMMENDATION:
Diagnostic mammography of the left breast with ultrasound in 6
months.

I have discussed the findings and recommendations with the patient.
If applicable, a reminder letter will be sent to the patient
regarding the next appointment.

BI-RADS CATEGORY  3: Probably benign.

ADDENDUM:
On the patient's diagnostic mammogram from March 08, 2020, there
was an asymmetry, thought to represent an island of dense glandular
tissue. In this same general region, a 3 mm mass was identified at 8
o'clock, thought to be a tiny cyst, too small to characterize. The
patient presents today for ultrasound-guided biopsy.

Additionally, the patient has a strong family history of breast
cancer on her paternal side. She has been genetically tested and is
awaiting results.

After reviewing the patient's imaging with her and answering all of
her questions, it was decided to cancel today's biopsy. Instead, she
will await her genetic testing results. If she is genetically
negative, her lifetime risk of breast cancer should be calculated
with a family based model such as the Fallon Jim. If the patient
is found to be high risk for breast cancer based on genetic testing
or the Fallon Jim, she should be screened with MRI annually. She
should be assessed for the possibility of antiestrogen therapy with
the Gail model if she is found to be high risk. For now, she wishes
to proceed with a six-month follow-up of the probably benign
mammographic findings. This will be due at the end August 2020.
She should also begin annual mammography since her paternal aunt was
diagnosed with breast cancer in her 20s.

If the patient changes her mind and wishes to proceed with biopsy,
she would need ultrasound-guided biopsy of the tiny mass thought to
be a small complicated cyst and a stereotactic biopsy of the
asymmetry.

*** End of Addendum ***
ACR Breast Density Category c: The breast tissue is heterogeneously
dense, which may obscure small masses.
FINDINGS: Additional tomograms were performed of the left breast. The
initially questioned possible left breast asymmetry is less apparent
and felt to be related to dense fibroglandular tissue.

Mammographic images were processed with CAD.

Targeted ultrasound of the inner left breast was performed. A tiny
probable complicated cyst at 8 o'clock 5 cm from nipple measures
x 0.2 x 0.3 cm. No suspicious masses or abnormalities seen in the
inner left breast, only heterogeneous fibroglandular tissue. This
heterogeneous fibroglandular tissue likely accounts for the
asymmetry seen in the inner left breast at mammography.
IMPRESSION: Probably benign left breast mass.

RECOMMENDATION:
Diagnostic mammography of the left breast with ultrasound in 6
months.

I have discussed the findings and recommendations with the patient.
If applicable, a reminder letter will be sent to the patient
regarding the next appointment.

BI-RADS CATEGORY  3: Probably benign.

## 2021-07-06 IMAGING — MG MM DIGITAL DIAGNOSTIC UNILAT*L* W/ TOMO W/ CAD
4 series · 5 of 12 positions shown · non-contrast
Comparison: Screening mammogram dated 02/21/2020.
COMPARISON: Screening mammogram dated 02/21/2020.

Addendum:
CLINICAL DATA: Screening recall from baseline mammography for left
breast asymmetry.

EXAM:
DIGITAL DIAGNOSTIC UNILATERAL LEFT MAMMOGRAM WITH TOMO AND CAD;
ULTRASOUND LEFT BREAST LIMITED

[L CC synth-2D]
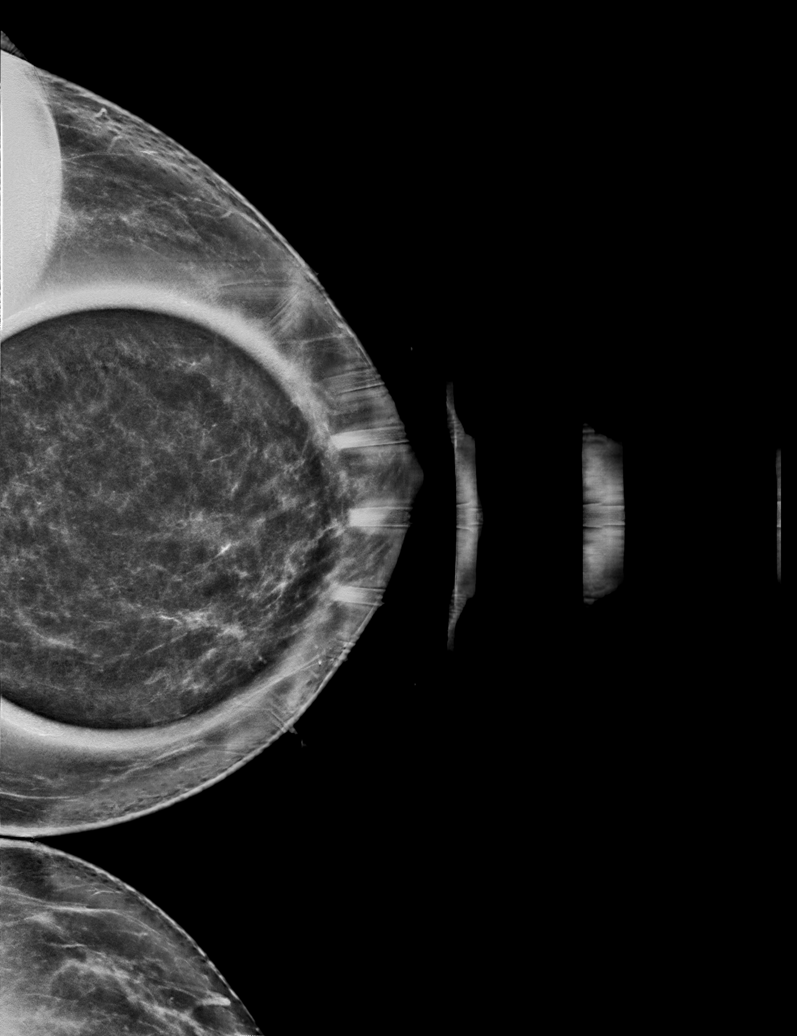

[L ML synth-2D]
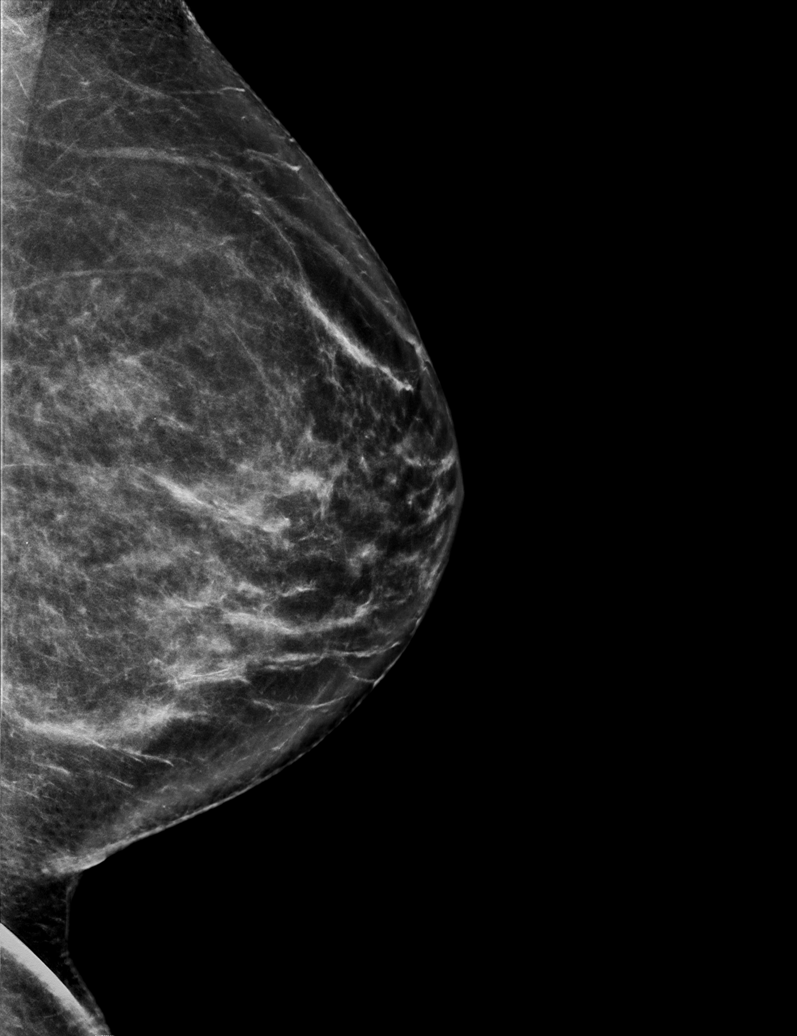

[L ML tomo · 2 of 78 frames shown]
[frame 26/78]
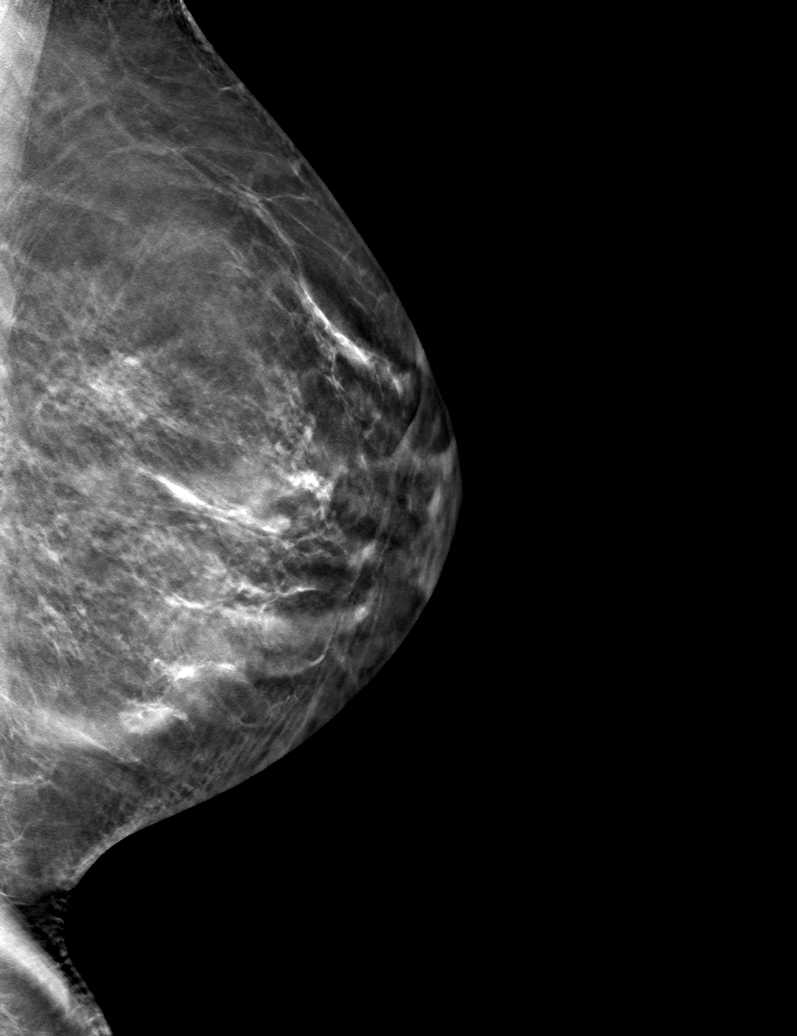
[frame 39/78]
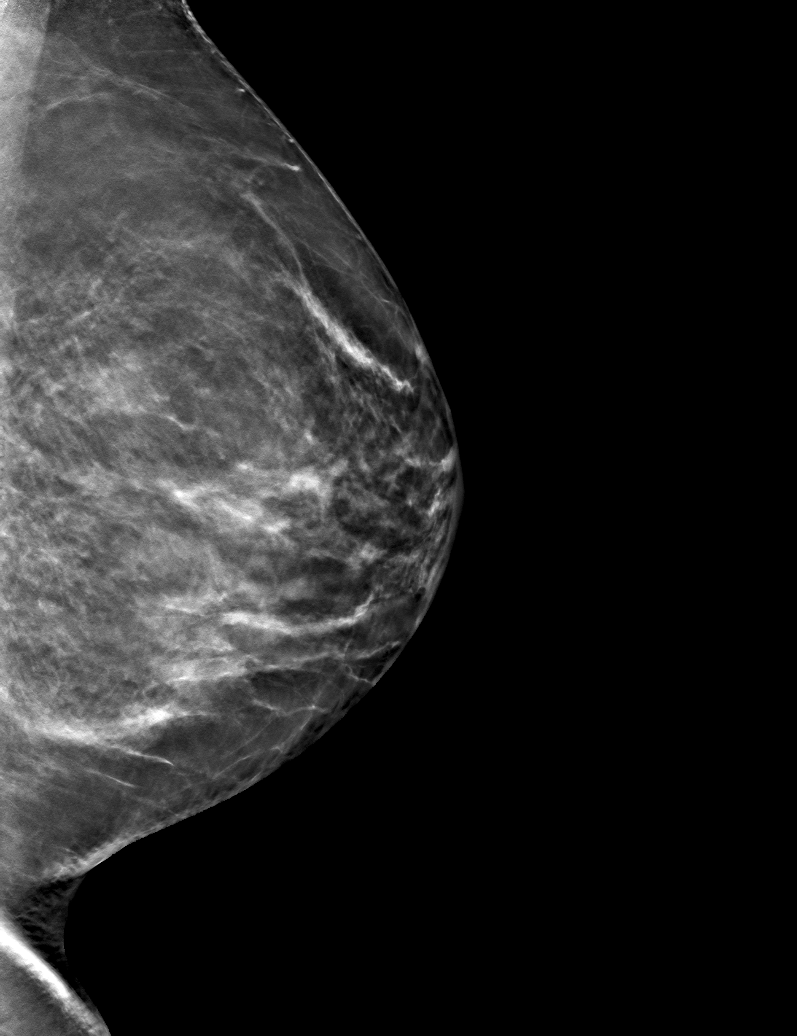

[L CC tomo · tomo slice 41/82.0]
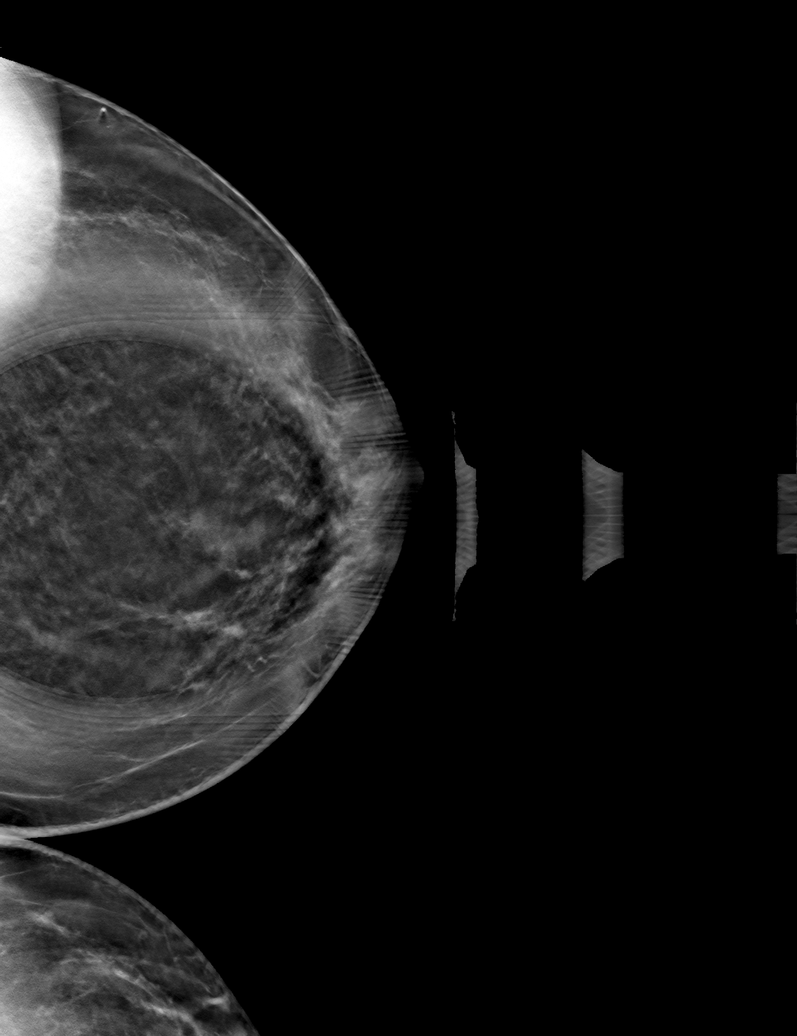

[5 of 12 positions shown; findings below may reference images not displayed]

ACR Breast Density Category c: The breast tissue is heterogeneously
dense, which may obscure small masses.
FINDINGS: Additional tomograms were performed of the left breast. The
initially questioned possible left breast asymmetry is less apparent
and felt to be related to dense fibroglandular tissue.

Mammographic images were processed with CAD.

Targeted ultrasound of the inner left breast was performed. A tiny
probable complicated cyst at 8 o'clock 5 cm from nipple measures
x 0.2 x 0.3 cm. No suspicious masses or abnormalities seen in the
inner left breast, only heterogeneous fibroglandular tissue. This
heterogeneous fibroglandular tissue likely accounts for the
asymmetry seen in the inner left breast at mammography.
IMPRESSION: Probably benign left breast mass.

RECOMMENDATION:
Diagnostic mammography of the left breast with ultrasound in 6
months.

I have discussed the findings and recommendations with the patient.
If applicable, a reminder letter will be sent to the patient
regarding the next appointment.

BI-RADS CATEGORY  3: Probably benign.

ADDENDUM:
On the patient's diagnostic mammogram from March 08, 2020, there
was an asymmetry, thought to represent an island of dense glandular
tissue. In this same general region, a 3 mm mass was identified at 8
o'clock, thought to be a tiny cyst, too small to characterize. The
patient presents today for ultrasound-guided biopsy.

Additionally, the patient has a strong family history of breast
cancer on her paternal side. She has been genetically tested and is
awaiting results.

After reviewing the patient's imaging with her and answering all of
her questions, it was decided to cancel today's biopsy. Instead, she
will await her genetic testing results. If she is genetically
negative, her lifetime risk of breast cancer should be calculated
with a family based model such as the Fallon Jim. If the patient
is found to be high risk for breast cancer based on genetic testing
or the Fallon Jim, she should be screened with MRI annually. She
should be assessed for the possibility of antiestrogen therapy with
the Gail model if she is found to be high risk. For now, she wishes
to proceed with a six-month follow-up of the probably benign
mammographic findings. This will be due at the end August 2020.
She should also begin annual mammography since her paternal aunt was
diagnosed with breast cancer in her 20s.

If the patient changes her mind and wishes to proceed with biopsy,
she would need ultrasound-guided biopsy of the tiny mass thought to
be a small complicated cyst and a stereotactic biopsy of the
asymmetry.

*** End of Addendum ***
ACR Breast Density Category c: The breast tissue is heterogeneously
dense, which may obscure small masses.
FINDINGS: Additional tomograms were performed of the left breast. The
initially questioned possible left breast asymmetry is less apparent
and felt to be related to dense fibroglandular tissue.

Mammographic images were processed with CAD.

Targeted ultrasound of the inner left breast was performed. A tiny
probable complicated cyst at 8 o'clock 5 cm from nipple measures
x 0.2 x 0.3 cm. No suspicious masses or abnormalities seen in the
inner left breast, only heterogeneous fibroglandular tissue. This
heterogeneous fibroglandular tissue likely accounts for the
asymmetry seen in the inner left breast at mammography.
IMPRESSION: Probably benign left breast mass.

RECOMMENDATION:
Diagnostic mammography of the left breast with ultrasound in 6
months.

I have discussed the findings and recommendations with the patient.
If applicable, a reminder letter will be sent to the patient
regarding the next appointment.

BI-RADS CATEGORY  3: Probably benign.

## 2022-04-21 NOTE — Telephone Encounter (Signed)
MyChart messgae sent to patient. 

## 2022-05-19 ENCOUNTER — Other Ambulatory Visit: Payer: Self-pay | Admitting: Internal Medicine

## 2022-05-19 ENCOUNTER — Encounter: Payer: Self-pay | Admitting: Internal Medicine

## 2022-07-28 ENCOUNTER — Encounter: Payer: Self-pay | Admitting: Internal Medicine

## 2022-07-28 ENCOUNTER — Telehealth: Payer: BC Managed Care – PPO | Admitting: Internal Medicine

## 2022-07-28 VITALS — Ht 65.0 in | Wt 136.4 lb

## 2022-07-28 DIAGNOSIS — R058 Other specified cough: Secondary | ICD-10-CM

## 2022-07-28 MED ORDER — BENZONATATE 200 MG PO CAPS: 200.0000 mg | ORAL_CAPSULE | Freq: Two times a day (BID) | ORAL | 1 refills | Status: AC

## 2022-07-28 MED ORDER — FAMOTIDINE 20 MG PO TABS: 20.0000 mg | ORAL_TABLET | Freq: Two times a day (BID) | ORAL | 0 refills | Status: AC

## 2022-07-28 MED ORDER — PREDNISONE 10 MG PO TABS: ORAL_TABLET | ORAL | 0 refills | Status: AC

## 2022-07-28 NOTE — Progress Notes (Unsigned)
Virtual Visit via [place]   Note   This format is felt to be most appropriate for this patient at this time.  All issues noted in this document were discussed and addressed.  No physical exam was performed (except for noted visual exam findings with Video Visits).   I connected with Shelby Terry on 07/28/22 at  2:00 PM EDT by a video enabled telemedicine application or telephone and verified that I am speaking with the correct person using two identifiers. Location patient: home Location provider: work or home office Persons participating in the virtual visit: patient, provider  I discussed the limitations, risks, security and privacy concerns of performing an evaluation and management service by telephone and the availability of in person appointments. I also discussed with the patient that there may be a patient responsible charge related to this service. The patient expressed understanding and agreed to proceed.  Reason for visit: lingering cough   HPI:  32 yr old with no significant pulmonary history presents with cough that has persisted for 2 months.  Staretd during sinusitis    ROS: See pertinent positives and negatives per HPI.  Past Medical History:  Diagnosis Date   Allergy    Anxiety    Chicken pox 1994   Chronic interstitial cystitis 10/25/2012   Depression    Fibromyalgia    Interstitial cystitis    Irritable bowel syndrome y-8   Migraines 2008    No past surgical history on file.  Family History  Problem Relation Age of Onset   Hyperlipidemia Mother    Diabetes Mother    Cancer Father 60       non small cell lung CA   Hyperlipidemia Father    Hypertension Father    Diabetes Father    Colon polyps Father    Asthma Brother    Cervical cancer Maternal Grandmother    Hypertension Maternal Grandfather    Hyperlipidemia Maternal Grandfather    Colon polyps Maternal Grandfather    Diabetes Maternal Grandfather    Breast cancer Paternal Grandmother 71   Lung  cancer Paternal Grandfather    Emphysema Paternal Grandfather    Prostate cancer Maternal Uncle    Diabetes Maternal Uncle    Liver disease Maternal Uncle        x 2   Breast cancer Paternal Aunt 22    SOCIAL HX: ***  No current outpatient medications on file.  EXAM:  VITALS per patient if applicable:  GENERAL: alert, oriented, appears well and in no acute distress  HEENT: atraumatic, conjunttiva clear, no obvious abnormalities on inspection of external nose and ears  NECK: normal movements of the head and neck  LUNGS: on inspection no signs of respiratory distress, breathing rate appears normal, no obvious gross SOB, gasping or wheezing  CV: no obvious cyanosis  MS: moves all visible extremities without noticeable abnormality  PSYCH/NEURO: pleasant and cooperative, no obvious depression or anxiety, speech and thought processing grossly intact  ASSESSMENT AND PLAN: There are no diagnoses linked to this encounter.    I discussed the assessment and treatment plan with the patient. The patient was provided an opportunity to ask questions and all were answered. The patient agreed with the plan and demonstrated an understanding of the instructions.   The patient was advised to call back or seek an in-person evaluation if the symptoms worsen or if the condition fails to improve as anticipated.   I spent 30 minutes dedicated to the care of this patient on the  date of this encounter to include pre-visit review of his medical history,  Face-to-face time with the patient , and post visit ordering of testing and therapeutics.    Crecencio Mc, MD

## 2022-07-28 NOTE — Patient Instructions (Signed)
Your cough may be coming from reflux, allergies, or asthma .  Continue the zyrtec   Add famotidine 20 mg twice daily for GERD treatment  Benzonotate cough capsules ever 12 hours  Prednisone taper for the wheezing   Chest x ray in 2 weeks if not resolved

## 2022-07-29 DIAGNOSIS — R058 Other specified cough: Secondary | ICD-10-CM | POA: Insufficient documentation

## 2022-07-29 NOTE — Assessment & Plan Note (Signed)
Discussed the possible etiologies of persistent cough including but not limited to post nasal drip,  Allergic rhinitis,  Asthma,  GERD and chronic cyclic cough due to persistent irritation.  She is not taking and ACE Inhibitor.  Suggested  continued treatment for allergic rhinitis,   adding H2 blocker for  GERD , prednisone and cough suppressant to break cycle and reevaluation in a few weeks.  rx famotidine 20 mg q 12,  prednisone taper and tessalone perles .  Chest  ray if not resolved after 2 weeks

## 2022-07-30 ENCOUNTER — Encounter: Payer: Self-pay | Admitting: Internal Medicine

## 2022-08-05 ENCOUNTER — Other Ambulatory Visit: Payer: BC Managed Care – PPO

## 2022-08-05 ENCOUNTER — Ambulatory Visit (INDEPENDENT_AMBULATORY_CARE_PROVIDER_SITE_OTHER): Payer: BC Managed Care – PPO

## 2022-08-05 DIAGNOSIS — R058 Other specified cough: Secondary | ICD-10-CM
